# Patient Record
Sex: Female | Born: 1980 | Race: Black or African American | Hispanic: No | Marital: Married | Smoking: Current some day smoker
Health system: Southern US, Community
[De-identification: ages and names within clinical notes are randomized; demographics above are authoritative.]

## PROBLEM LIST (undated history)

## (undated) ENCOUNTER — Inpatient Hospital Stay (HOSPITAL_COMMUNITY): Payer: Self-pay

## (undated) DIAGNOSIS — I10 Essential (primary) hypertension: Secondary | ICD-10-CM

## (undated) DIAGNOSIS — R87629 Unspecified abnormal cytological findings in specimens from vagina: Secondary | ICD-10-CM

## (undated) HISTORY — DX: Unspecified abnormal cytological findings in specimens from vagina: R87.629

## (undated) HISTORY — PX: LEG SURGERY: SHX1003

## (undated) HISTORY — PX: KNEE CARTILAGE SURGERY: SHX688

---

## 2010-01-07 ENCOUNTER — Encounter: Payer: Self-pay | Admitting: Obstetrics and Gynecology

## 2010-01-07 ENCOUNTER — Inpatient Hospital Stay (HOSPITAL_COMMUNITY): Admission: RE | Admit: 2010-01-07 | Discharge: 2010-01-09 | Payer: Self-pay | Admitting: Obstetrics and Gynecology

## 2011-03-15 LAB — CBC
HCT: 29.3 % — ABNORMAL LOW (ref 36.0–46.0)
HCT: 33.2 % — ABNORMAL LOW (ref 36.0–46.0)
Hemoglobin: 9.8 g/dL — ABNORMAL LOW (ref 12.0–15.0)
MCHC: 33.5 g/dL (ref 30.0–36.0)
Platelets: 322 10*3/uL (ref 150–400)
RBC: 3.09 MIL/uL — ABNORMAL LOW (ref 3.87–5.11)
RBC: 3.54 MIL/uL — ABNORMAL LOW (ref 3.87–5.11)
WBC: 12.5 10*3/uL — ABNORMAL HIGH (ref 4.0–10.5)

## 2011-03-15 LAB — RPR: RPR Ser Ql: NONREACTIVE

## 2013-10-05 ENCOUNTER — Other Ambulatory Visit: Payer: Self-pay

## 2013-10-05 ENCOUNTER — Emergency Department (HOSPITAL_BASED_OUTPATIENT_CLINIC_OR_DEPARTMENT_OTHER)
Admission: EM | Admit: 2013-10-05 | Discharge: 2013-10-05 | Discharge status: Against Medical Advice | Payer: Medicaid Other | Attending: Emergency Medicine | Admitting: Emergency Medicine

## 2013-10-05 ENCOUNTER — Encounter (HOSPITAL_BASED_OUTPATIENT_CLINIC_OR_DEPARTMENT_OTHER): Payer: Self-pay | Admitting: Emergency Medicine

## 2013-10-05 ENCOUNTER — Emergency Department (HOSPITAL_BASED_OUTPATIENT_CLINIC_OR_DEPARTMENT_OTHER): Payer: Medicaid Other

## 2013-10-05 DIAGNOSIS — R072 Precordial pain: Secondary | ICD-10-CM | POA: Insufficient documentation

## 2013-10-05 DIAGNOSIS — R079 Chest pain, unspecified: Secondary | ICD-10-CM

## 2013-10-05 DIAGNOSIS — F17200 Nicotine dependence, unspecified, uncomplicated: Secondary | ICD-10-CM | POA: Insufficient documentation

## 2013-10-05 DIAGNOSIS — Z3202 Encounter for pregnancy test, result negative: Secondary | ICD-10-CM | POA: Insufficient documentation

## 2013-10-05 MED ORDER — ASPIRIN 81 MG PO CHEW
324.0000 mg | CHEWABLE_TABLET | Freq: Once | ORAL | Status: AC
  Administered 2013-10-05: 324 mg via ORAL
  Filled 2013-10-05: qty 4

## 2013-10-05 MED ORDER — NITROGLYCERIN 0.4 MG SL SUBL
0.4000 mg | SUBLINGUAL_TABLET | SUBLINGUAL | Status: DC | PRN
  Administered 2013-10-05: 0.4 mg via SUBLINGUAL
  Filled 2013-10-05: qty 25

## 2013-10-05 NOTE — ED Notes (Signed)
Pt laughing and playing with her daughters during assessment and triage.

## 2013-10-05 NOTE — ED Provider Notes (Signed)
CSN: [id number]     Arrival date & time 10/05/13  1027 History   First MD Initiated Contact with Patient 10/05/13 0914     Chief Complaint  Patient presents with  . Chest Pain   (Consider location/radiation/quality/duration/timing/severity/associated sxs/prior Treatment) HPI Comments: Pt comes in with cc of chest pain. No medical hx, but does admit to family hx of premature CAD (father, age 11s). Pt reports having intermittent chest pain, unprovoked, that is midsternal, and no radiating. She occasionally gets dib and sweats with it. Same pain started at 3 am yday, unprovoked, no n/v/f/c, cough. No hx of dvt, pe and no risk factors for the same. Pt has not had a cardiac eval done so far. Pt is hacing the pain during our evaluation.  Patient is a 32 y.o. female presenting with chest pain. The history is provided by the patient.  Chest Pain Associated symptoms: no abdominal pain, no headache, no nausea, no shortness of breath and not vomiting     History reviewed. No pertinent past medical history. History reviewed. No pertinent past surgical history. History reviewed. No pertinent family history. History  Substance Use Topics  . Smoking status: Current Every Day Smoker -- 0.50 packs/day  . Smokeless tobacco: Not on file  . Alcohol Use: No   OB History   Grav Para Term Preterm Abortions TAB SAB Ect Mult Living                 Review of Systems  Constitutional: Negative for activity change.  Respiratory: Negative for shortness of breath.   Cardiovascular: Positive for chest pain.  Gastrointestinal: Negative for nausea, vomiting and abdominal pain.  Genitourinary: Negative for dysuria.  Musculoskeletal: Negative for neck pain.  Neurological: Negative for headaches.    Allergies  Review of patient's allergies indicates no known allergies.  Home Medications  No current outpatient prescriptions on file. BP 120/91  Pulse 57  Temp(Src) 98.1 F (36.7 C) (Oral)  Resp 18  Ht 5\' 4"   (1.626 m)  Wt 220 lb (99.791 kg)  BMI 37.74 kg/m2  SpO2 100% Physical Exam  Nursing note and vitals reviewed. Constitutional: She is oriented to person, place, and time. She appears well-developed and well-nourished.  HENT:  Head: Normocephalic and atraumatic.  Eyes: EOM are normal. Pupils are equal, round, and reactive to light.  Neck: Neck supple.  Cardiovascular: Normal rate, regular rhythm and normal heart sounds.   No murmur heard. Pulmonary/Chest: Effort normal. No respiratory distress.  Abdominal: Soft. She exhibits no distension. There is no tenderness. There is no rebound and no guarding.  Neurological: She is alert and oriented to person, place, and time.  Skin: Skin is warm and dry.    ED Course  Procedures (including critical care time) Labs Review Labs Reviewed  CBC WITH DIFFERENTIAL  BASIC METABOLIC PANEL  TROPONIN I  URINALYSIS, ROUTINE W REFLEX MICROSCOPIC  PREGNANCY, URINE   Imaging Review Dg Chest 2 View  10/05/2013   CLINICAL DATA:  Chest tightness for 9 months, smoker  EXAM: CHEST  2 VIEW  COMPARISON:  None.  FINDINGS: The heart size and mediastinal contours are within normal limits. Both lungs are clear. The visualized skeletal structures are unremarkable.  IMPRESSION: No active cardiopulmonary disease.   Electronically Signed   By: Esperanza Heir M.D.   On: 10/05/2013 09:46    EKG Interpretation   None       MDM  No diagnosis found.   Date: 10/05/2013  Rate: 59  Rhythm:  normal sinus rhythm  QRS Axis: normal  Intervals: normal  ST/T Wave abnormalities: normal  Conduction Disutrbances: none  Narrative Interpretation: unremarkable  Differential diagnosis includes: ACS syndrome CHF exacerbation Valvular disorder Myocarditis Pericarditis Pericardial effusion Pneumonia Pleural effusion Pulmonary edema PE Anemia Musculoskeletal pain  Pt comes in with mid sternal chest pain, pressure like, intermittent x 9 months. Our concerns are  possible ACS event, especially given premature CAD in the family and some concerning constitutionals.  We initiated the workup in the ED. Pt refused nitro and asked for work note for today and tomorrow.  i had informed her that we will need to finish the workup before we give her any note, and just give 1 day work note.  Allegedly, patient got agitated with the RNs few minutes later, again over the work note issue, and wanted to leave. I had informed RNs to haver her sign AMA if she leaves, and it appears that she just refused to sign any thing and eloped.  Derwood Kaplan, MD 10/05/13 1105

## 2013-10-05 NOTE — ED Notes (Signed)
Pt reports intermitted CP x 8-9 months ago. Denies SOB or sweating today but states "sometimes when they start I do get short of breath" Here today "to find out what's going on"

## 2013-10-05 NOTE — ED Notes (Addendum)
Plan for treatment explained to the patient. When Nitroglycerin mechanism of action was explained pt stated "that is for real pain, I don't want that". Further education was provided and patient then accepted to take medication. Patient then stated "the doctor told me I could just get my work note" "I can't stay here for all those tests". Pt stated she "need someone to look after my kids." I reported pt's comments to EDP, he explained tests were to ensure pt's safety this was communicated to patient. Patient stated she didn't "have time for all that" as she "needed someone to watch her kids" she stated she "just needed a note to be out of work today and tomorrow" and "I can't stay here with my kids". Pt refused to have labs drawn and provide urine specimen. Risks of leaving were discussed with patient. Pt verbalized understanding.

## 2013-10-05 NOTE — ED Notes (Signed)
Pt ambulated from room 11 to xray and back. No acute distress noted.

## 2014-01-07 ENCOUNTER — Encounter (HOSPITAL_BASED_OUTPATIENT_CLINIC_OR_DEPARTMENT_OTHER): Payer: Self-pay | Admitting: Emergency Medicine

## 2014-01-07 ENCOUNTER — Emergency Department (HOSPITAL_BASED_OUTPATIENT_CLINIC_OR_DEPARTMENT_OTHER): Payer: Medicaid Other

## 2014-01-07 ENCOUNTER — Emergency Department (HOSPITAL_BASED_OUTPATIENT_CLINIC_OR_DEPARTMENT_OTHER)
Admission: EM | Admit: 2014-01-07 | Discharge: 2014-01-07 | Disposition: A | Discharge status: Home / Self Care | Payer: Medicaid Other | Attending: Emergency Medicine | Admitting: Emergency Medicine

## 2014-01-07 DIAGNOSIS — IMO0002 Reserved for concepts with insufficient information to code with codable children: Secondary | ICD-10-CM | POA: Insufficient documentation

## 2014-01-07 DIAGNOSIS — S8392XA Sprain of unspecified site of left knee, initial encounter: Secondary | ICD-10-CM

## 2014-01-07 DIAGNOSIS — X500XXA Overexertion from strenuous movement or load, initial encounter: Secondary | ICD-10-CM | POA: Insufficient documentation

## 2014-01-07 DIAGNOSIS — S76911A Strain of unspecified muscles, fascia and tendons at thigh level, right thigh, initial encounter: Secondary | ICD-10-CM

## 2014-01-07 DIAGNOSIS — R296 Repeated falls: Secondary | ICD-10-CM | POA: Insufficient documentation

## 2014-01-07 DIAGNOSIS — F17200 Nicotine dependence, unspecified, uncomplicated: Secondary | ICD-10-CM | POA: Insufficient documentation

## 2014-01-07 DIAGNOSIS — Y939 Activity, unspecified: Secondary | ICD-10-CM | POA: Insufficient documentation

## 2014-01-07 DIAGNOSIS — Y9229 Other specified public building as the place of occurrence of the external cause: Secondary | ICD-10-CM | POA: Insufficient documentation

## 2014-01-07 MED ORDER — HYDROCODONE-ACETAMINOPHEN 5-325 MG PO TABS: 2.0000 | ORAL_TABLET | Freq: Four times a day (QID) | ORAL | Status: DC | PRN

## 2014-01-07 NOTE — ED Provider Notes (Signed)
CSN: [id number]     Arrival date & time 01/07/14  1744 History   First MD Initiated Contact with Patient 01/07/14 1934     Chief Complaint  Patient presents with  . Fall   (Consider location/radiation/quality/duration/timing/severity/associated sxs/prior Treatment) HPI Slipped and fell in a restaurant 2 days ago and feels like she pulled a muscle in her right thigh hamstrings area as well as twisted her left knee with some left lateral knee joint pain, there is no head or neck injury no amnesia no neck pain no back pain no chest pain no shortness breath no abdominal pain no injury to her arms no pain to her hips ankles or feet she is walking with very mild limp without weakness or numbness there is no swelling or deformity to her legs her pain is mild to moderate but she is having trouble sleeping at night and would like pain medicine for nighttime there is no treatment prior to arrival.  History reviewed. No pertinent past medical history. Past Surgical History  Procedure Laterality Date  . Leg surgery     No family history on file. History  Substance Use Topics  . Smoking status: Current Every Day Smoker -- 0.50 packs/day  . Smokeless tobacco: Never Used  . Alcohol Use: Yes     Comment: occasional   OB History   Grav Para Term Preterm Abortions TAB SAB Ect Mult Living                 Review of Systems 10 Systems reviewed and are negative for acute change except as noted in the HPI. Allergies  Review of patient's allergies indicates no known allergies.  Home Medications   Current Outpatient Rx  Name  Route  Sig  Dispense  Refill  . HYDROcodone-acetaminophen (NORCO) 5-325 MG per tablet   Oral   Take 2 tablets by mouth every 6 (six) hours as needed for severe pain.   10 tablet   0    BP 132/81  Pulse 70  Temp(Src) 99.5 F (37.5 C) (Oral)  Resp 20  Ht 5\' 4"  (1.626 m)  Wt 230 lb (104.327 kg)  BMI 39.46 kg/m2  SpO2 98% Physical Exam  Nursing note and vitals  reviewed. Constitutional:  Awake, alert, nontoxic appearance.  HENT:  Head: Atraumatic.  Eyes: Right eye exhibits no discharge. Left eye exhibits no discharge.  Neck: Neck supple.  Pulmonary/Chest: Effort normal. She exhibits no tenderness.  Abdominal: Soft. There is no tenderness. There is no rebound.  Musculoskeletal: She exhibits tenderness.  Baseline ROM, no obvious new focal weakness. Normal gait. Bilateral hips ankles and feet nontender bilateral dorsalis pedis pulses intact normal light touch both legs good movement both legs her right hamstrings area is mildly tender right distal quadriceps minimally tender right patella and patellar tendon nontender she has full extension against resistance right knee she has full flexion right knee she has negative Lachman's negative McMurray's no laxity with varus or valgus stress testing right knee her left leg examination reveals no tenderness to her thigh or calf her left knee has mild lateral joint line tenderness with no tenderness over the medial left knee joint line she has minimal tenderness over the left patella and patellar tendon with full extension against resistance she's negative Lachman's testing negative the nares testing no laxity with varus or valgus stress testing bilateral calf muscles are nontender there is no swelling or deformity of her knees.  Neurological: She is alert.  Mental status and motor  strength appears baseline for patient and situation.  Skin: No rash noted.  Psychiatric: She has a normal mood and affect.    ED Course  Procedures (including critical care time) Patient / Family / Caregiver informed of clinical course, understand medical decision-making process, and agree with plan. Labs Review Labs Reviewed - No data to display Imaging Review Dg Knee Complete 4 Views Left  01/07/2014   CLINICAL DATA:  Left knee pain  EXAM: LEFT KNEE - COMPLETE 4+ VIEW  COMPARISON:  None.  FINDINGS: No acute fracture is identified.  Degenerative changes in the medial joint space are seen. Some remodeling of the medial tibial plateau is noted which may be related to prior trauma with healing. Clinical correlation is recommended.  IMPRESSION: No acute abnormality noted.   Electronically Signed   By: Alcide Clever M.D.   On: 01/07/2014 18:20   Dg Knee Complete 4 Views Right  01/07/2014   CLINICAL DATA:  Right knee pain  EXAM: RIGHT KNEE - COMPLETE 4+ VIEW  COMPARISON:  None.  FINDINGS: No acute fracture is identified. There is some remodeling of the medial tibial plateau suggesting prior fracture with healing. Clinical correlation is recommended.  IMPRESSION: No acute abnormality noted.   Electronically Signed   By: Alcide Clever M.D.   On: 01/07/2014 18:19    EKG Interpretation   None       MDM   1. Muscle strain of thigh, right, initial encounter   2. Left knee sprain, initial encounter    I doubt any other EMC precluding discharge at this time.    Hurman Horn, MD 01/08/14 [phone]

## 2014-01-07 NOTE — ED Notes (Signed)
Pt reports she fell at a restaurant Friday night and landed on both knees. States she has a sharp pain in the back of her legs "feels like pulling"

## 2014-01-07 NOTE — Discharge Instructions (Signed)
Activity as tolerated. Ice pack, heat pad, or massage for up to 20 minutes at a time as needed. Return sooner if you develop weakness or numbness, redness, fever, color change to your legs, swelling to her legs, chest pain, shortness breath, or other concerns.

## 2016-12-28 NOTE — L&D Delivery Note (Signed)
Patient is a 36 y.o. now G3P3003s/p NSVD at 3377w6d, who was admitted for IOL for gHTN.  Delivery Note At 2:48 PM a viable female was delivered via Vaginal, Spontaneous Delivery (Presentation:cephalic ; OA).  APGAR: 9, 9; weight pending Placenta status:intact .  Cord: 3 vessel  with the following complications: placenta needing manual extraction .    Anesthesia: epidural Episiotomy: None Lacerations: None Suture Repair: none Est. Blood Loss (mL): 400  Mom to postpartum.  Baby to Couplet care / Skin to Skin.  Head delivered OA. No nuchal cord present. Shoulder and body delivered in usual fashion. Infant with spontaneous cry, placed on mother's abdomen, dried and bulb suctioned. Cord clamped x 2 after 1-minute delay, and cut by family member. Cord blood drawn. After 30 minutes of gentle cord traction a Manual extraction of placenta was attempted by Donette LarryMelanie Amal Saiki, CNM and then completed by Dr. Catalina AntiguaPeggy Constant. The placenta was succesfully delivered, intact. No retained products. Fundus firm with massage and Pitocin. Perineum inspected with no laceration,   Nancy S. Minott,  MD Family Medicine Resident PGY-1 10/26/17, 4:07 PM  Midwife attestation: I was gloved and present for delivery in its entirety and I agree with the above resident's note.  Donette LarryMelanie Alamin Jacobs, CNM 5:09 PM

## 2017-05-10 LAB — OB RESULTS CONSOLE RUBELLA ANTIBODY, IGM: RUBELLA: IMMUNE

## 2017-05-10 LAB — OB RESULTS CONSOLE GC/CHLAMYDIA
Chlamydia: NEGATIVE
Gonorrhea: NEGATIVE

## 2017-05-10 LAB — OB RESULTS CONSOLE HEPATITIS B SURFACE ANTIGEN: HEP B S AG: NEGATIVE

## 2017-05-10 LAB — OB RESULTS CONSOLE ANTIBODY SCREEN: ANTIBODY SCREEN: NEGATIVE

## 2017-05-10 LAB — OB RESULTS CONSOLE HGB/HCT, BLOOD
HCT: 37
Hemoglobin: 12

## 2017-05-10 LAB — OB RESULTS CONSOLE ABO/RH: RH Type: POSITIVE

## 2017-05-10 LAB — OB RESULTS CONSOLE HIV ANTIBODY (ROUTINE TESTING): HIV: NONREACTIVE

## 2017-06-18 ENCOUNTER — Encounter: Payer: Self-pay | Admitting: Obstetrics & Gynecology

## 2017-06-18 ENCOUNTER — Ambulatory Visit (INDEPENDENT_AMBULATORY_CARE_PROVIDER_SITE_OTHER): Payer: Medicaid Other | Admitting: Obstetrics & Gynecology

## 2017-06-18 VITALS — BP 121/72 | HR 72 | Wt 241.0 lb

## 2017-06-18 DIAGNOSIS — O09529 Supervision of elderly multigravida, unspecified trimester: Secondary | ICD-10-CM | POA: Insufficient documentation

## 2017-06-18 DIAGNOSIS — Z3492 Encounter for supervision of normal pregnancy, unspecified, second trimester: Secondary | ICD-10-CM

## 2017-06-18 DIAGNOSIS — O99212 Obesity complicating pregnancy, second trimester: Secondary | ICD-10-CM

## 2017-06-18 DIAGNOSIS — O09522 Supervision of elderly multigravida, second trimester: Secondary | ICD-10-CM

## 2017-06-18 DIAGNOSIS — O99210 Obesity complicating pregnancy, unspecified trimester: Secondary | ICD-10-CM | POA: Insufficient documentation

## 2017-06-18 DIAGNOSIS — Z3480 Encounter for supervision of other normal pregnancy, unspecified trimester: Secondary | ICD-10-CM | POA: Insufficient documentation

## 2017-06-18 NOTE — Progress Notes (Signed)
   PRENATAL VISIT NOTE  Subjective:  Nancy Jacobs is a 36 y.o. S AA  G3P2000 at 8133w2d being seen today for ongoing prenatal care.  She is currently monitored for the following issues for this low-risk pregnancy and has Supervision of other normal pregnancy, antepartum; Obesity in pregnancy; and AMA (advanced maternal age) multigravida 35+ on her problem list.  Patient reports no complaints.   . Vag. Bleeding: None.  Movement: Present. Denies leaking of fluid.   The following portions of the patient's history were reviewed and updated as appropriate: allergies, current medications, past family history, past medical history, past social history, past surgical history and problem list. Problem list updated.  Objective:   Vitals:   06/18/17 0929  BP: 121/72  Pulse: 72  Weight: 241 lb (109.3 kg)    Fetal Status: Fetal Heart Rate (bpm): 135   Movement: Present     General:  Alert, oriented and cooperative. Patient is in no acute distress.  Skin: Skin is warm and dry. No rash noted.   Cardiovascular: Normal heart rate noted  Respiratory: Normal respiratory effort, no problems with respiration noted  Abdomen: Soft, gravid, appropriate for gestational age. Pain/Pressure: Absent     Pelvic:  Cervical exam deferred        Extremities: Normal range of motion.  Edema: None  Mental Status: Normal mood and affect. Normal behavior. Normal judgment and thought content.   Assessment and Plan:  Pregnancy: G3P2000 at 4633w2d  1. Prenatal care in second trimester  - US MFM OB DETAIL +14 WK; Future  2. Supervision of other normal pregnancy, antepartum   3. Obesity in pregnancy  - Hemoglobin A1c  4. Elderly multigravida in second trimester - normal NIPS  Preterm labor symptoms and general obstetric precautions including but not limited to vaginal bleeding, contractions, leaking of fluid and fetal movement were reviewed in detail with the patient. Please refer to After Visit Summary for other  counseling recommendations.  Return in about 4 weeks (around 07/16/2017).   Allie BossierMyra C Seanpatrick Maisano, MD

## 2017-06-19 LAB — HEMOGLOBIN A1C
Est. average glucose Bld gHb Est-mCnc: 117 mg/dL
Hgb A1c MFr Bld: 5.7 % — ABNORMAL HIGH (ref 4.8–5.6)

## 2017-06-24 ENCOUNTER — Emergency Department (HOSPITAL_BASED_OUTPATIENT_CLINIC_OR_DEPARTMENT_OTHER): Payer: Medicaid Other

## 2017-06-24 ENCOUNTER — Emergency Department (HOSPITAL_BASED_OUTPATIENT_CLINIC_OR_DEPARTMENT_OTHER)
Admission: EM | Admit: 2017-06-24 | Discharge: 2017-06-24 | Disposition: A | Discharge status: Home / Self Care | Payer: Medicaid Other | Attending: Emergency Medicine | Admitting: Emergency Medicine

## 2017-06-24 ENCOUNTER — Encounter (HOSPITAL_BASED_OUTPATIENT_CLINIC_OR_DEPARTMENT_OTHER): Payer: Self-pay | Admitting: Emergency Medicine

## 2017-06-24 DIAGNOSIS — Z87891 Personal history of nicotine dependence: Secondary | ICD-10-CM | POA: Insufficient documentation

## 2017-06-24 DIAGNOSIS — O2312 Infections of bladder in pregnancy, second trimester: Secondary | ICD-10-CM | POA: Diagnosis not present

## 2017-06-24 DIAGNOSIS — Z3A20 20 weeks gestation of pregnancy: Secondary | ICD-10-CM | POA: Diagnosis not present

## 2017-06-24 DIAGNOSIS — N3001 Acute cystitis with hematuria: Secondary | ICD-10-CM

## 2017-06-24 DIAGNOSIS — R1012 Left upper quadrant pain: Secondary | ICD-10-CM | POA: Insufficient documentation

## 2017-06-24 DIAGNOSIS — O9989 Other specified diseases and conditions complicating pregnancy, childbirth and the puerperium: Secondary | ICD-10-CM | POA: Diagnosis present

## 2017-06-24 LAB — URINALYSIS, ROUTINE W REFLEX MICROSCOPIC
BILIRUBIN URINE: NEGATIVE
Glucose, UA: NEGATIVE mg/dL
Ketones, ur: 15 mg/dL — AB
LEUKOCYTES UA: NEGATIVE
NITRITE: NEGATIVE
PH: 5.5 (ref 5.0–8.0)
Protein, ur: NEGATIVE mg/dL
SPECIFIC GRAVITY, URINE: 1.03 (ref 1.005–1.030)

## 2017-06-24 LAB — URINALYSIS, MICROSCOPIC (REFLEX)

## 2017-06-24 LAB — PREGNANCY, URINE: PREG TEST UR: POSITIVE — AB

## 2017-06-24 MED ORDER — CEPHALEXIN 500 MG PO CAPS: 500.0000 mg | ORAL_CAPSULE | Freq: Three times a day (TID) | ORAL | 0 refills | Status: DC

## 2017-06-24 MED ORDER — CEPHALEXIN 250 MG PO CAPS
500.0000 mg | ORAL_CAPSULE | Freq: Once | ORAL | Status: AC
  Administered 2017-06-24: 500 mg via ORAL
  Filled 2017-06-24: qty 2

## 2017-06-24 NOTE — ED Provider Notes (Signed)
[facility]   CSN: [id number] Arrival date & time: 06/24/17  1646   By signing my name below, I, Vista Minkobert Ross, attest that this documentation has been prepared under the direction and in the presence of Rolland PorterJames, Edgel Degnan, MD. Electronically signed, Vista Minkobert Ross, ED Scribe. 06/24/17. 8:11 PM.  History   Chief Complaint Chief Complaint  Patient presents with  . Abdominal Pain    20 weeks preg    HPI HPI Comments: Nancy Jacobs is a 36 y.o. female who is G3P2A2, who presents to the Emergency Department complaining of constant, dull suprapubic abdominal pain that radiates to her lower back, onset four days ago. Pt also reports intermittent nausea but states that this has been similar to her previous pregnancies and has not been worse during this pregnancy. Pt further reports intermittent dysuria. Her UA here showed moderate hgb but pt denies any spotting noticed. She is currently seeing an OB here. No fever.  The history is provided by the patient. No language interpreter was used.    Past Medical History:  Diagnosis Date  . Vaginal Pap smear, abnormal     Patient Active Problem List   Diagnosis Date Noted  . Supervision of other normal pregnancy, antepartum 06/18/2017  . Obesity in pregnancy 06/18/2017  . AMA (advanced maternal age) multigravida 35+ 06/18/2017    Past Surgical History:  Procedure Laterality Date  . KNEE CARTILAGE SURGERY    . LEG SURGERY      OB History    Gravida Para Term Preterm AB Living   3 2 2          SAB TAB Ectopic Multiple Live Births           2       Home Medications    Prior to Admission medications   Medication Sig Start Date End Date Taking? Authorizing Provider  cephALEXin (KEFLEX) 500 MG capsule Take 1 capsule (500 mg total) by mouth 3 (three) times daily. 06/24/17   Rolland PorterJames, Jetta Murray, MD  Doxylamine-Pyridoxine 10-10 MG TBEC Take by mouth. 04/16/17   [provider]  IRON PO Take by mouth.    [provider]    Family History Family History  Problem Relation Age of Onset  . Cancer Mother        liver  . Hypertension Mother   . Hypertension Father   . Hypertension Sister   . Cancer Maternal Aunt   . Cancer Maternal Grandmother   . Diabetes Maternal Grandfather     Social History Social History  Substance Use Topics  . Smoking status: Former Smoker    Packs/day: 0.50  . Smokeless tobacco: Never Used  . Alcohol use Yes     Comment: occasional     Allergies   Patient has no known allergies.   Review of Systems Review of Systems  Constitutional: Negative for appetite change, chills, diaphoresis, fatigue and fever.  HENT: Negative for mouth sores, sore throat and trouble swallowing.   Eyes: Negative for visual disturbance.  Respiratory: Negative for cough, chest tightness, shortness of breath and wheezing.   Cardiovascular: Negative for chest pain.  Gastrointestinal: Positive for abdominal pain (suprapubic radiating to lower back) and nausea. Negative for abdominal distention, diarrhea and vomiting.  Endocrine: Negative for polydipsia, polyphagia and polyuria.  Genitourinary: Positive for dysuria. Negative for frequency and hematuria.  Musculoskeletal: Negative for gait problem.  Skin: Negative for color change, pallor and rash.  Neurological: Negative for dizziness, syncope, light-headedness and headaches.  Hematological: Does not bruise/bleed easily.  Psychiatric/Behavioral: Negative for behavioral problems and confusion.     Physical Exam Updated Vital Signs BP 118/73 (BP Location: Right Arm)   Pulse 68   Resp 18   Ht 5\' 4"  (1.626 m)   Wt 108.9 kg (240 lb)   LMP 02/03/2017 (Exact Date)   SpO2 100%   BMI 41.20 kg/m   Physical Exam   ED Treatments / Results  DIAGNOSTIC STUDIES: Oxygen Saturation is 100% on RA, normal by my interpretation.  COORDINATION OF CARE: 8:14 PM-Discussed treatment plan with pt at bedside and pt agreed to plan.    Labs (all labs ordered are listed, but only abnormal results are displayed) Labs Reviewed  URINALYSIS, ROUTINE W REFLEX MICROSCOPIC - Abnormal; Notable for the following:       Result Value   APPearance CLOUDY (*)    Hgb urine dipstick MODERATE (*)    Ketones, ur 15 (*)    All other components within normal limits  URINALYSIS, MICROSCOPIC (REFLEX) - Abnormal; Notable for the following:    Bacteria, UA MANY (*)    Squamous Epithelial / LPF 6-30 (*)    All other components within normal limits  PREGNANCY, URINE - Abnormal; Notable for the following:    Preg Test, Ur POSITIVE (*)    All other components within normal limits  URINE CULTURE    EKG  EKG Interpretation None       Radiology US Renal  Result Date: 06/24/2017 CLINICAL DATA:  Left flank pain for 4 days EXAM: RENAL / URINARY TRACT ULTRASOUND COMPLETE COMPARISON:  None. FINDINGS: Right Kidney: Length: 12.6 cm. Echogenicity within normal limits. No mass or hydronephrosis visualized. Multiple hypoechoic areas near the renal hilum may be parapelvic cysts or prominent pyramids. Left Kidney: Length: 12.6 cm. Echogenicity within normal limits. No mass or hydronephrosis visualized. Bladder: Appears normal for degree of bladder distention. The left ureteral jet was visualized. IMPRESSION: No nephrolithiasis or hydronephrosis. Electronically Signed   By: Deatra Robinson M.D.   On: 06/24/2017 21:23    Procedures Procedures (including critical care time)  Medications Ordered in ED Medications  cephALEXin (KEFLEX) capsule 500 mg (500 mg Oral Given 06/24/17 2139)     Initial Impression / Assessment and Plan / ED Course  I have reviewed the triage vital signs and the nursing notes.  Pertinent labs & imaging results that were available during my care of the patient were reviewed by me and considered in my medical decision making (see chart for details).     Blood, white blood cells, and bacteria in urine. Culture pending.  Negative ultrasound. Reassuring bedside ultrasound showing positive fetal heart tones and fetal movement. We'll treat with Keflex. Symptoms are mild. Exam is benign. OB/GYN follow-up. ER with acute changes.  Final Clinical Impressions(s) / ED Diagnoses   Final diagnoses:  Acute cystitis with hematuria    New Prescriptions New Prescriptions   CEPHALEXIN (KEFLEX) 500 MG CAPSULE    Take 1 capsule (500 mg total) by mouth 3 (three) times daily.  I personally performed the services described in this documentation, which was scribed in my presence. The recorded information has been reviewed and is accurate.     Rolland Porter, MD 06/24/17 2221

## 2017-06-24 NOTE — ED Triage Notes (Signed)
Patient reports that she is having pain to her left upper and lower quadrants. The patient states that she is [redacted] weeks pregnant. Patient states that this pain has been going on for 3 days.

## 2017-06-24 NOTE — Discharge Instructions (Signed)
Follow-up with your OB/GYN physician. Return to ER as needed for new or worsening symptoms

## 2017-06-24 NOTE — ED Notes (Signed)
Pt stated that pain started 4 days ago after lifting a box from her work.  She just the pain to go away.

## 2017-06-26 LAB — URINE CULTURE

## 2017-06-29 ENCOUNTER — Ambulatory Visit (HOSPITAL_COMMUNITY): Payer: Medicaid Other | Attending: Obstetrics & Gynecology

## 2017-07-14 ENCOUNTER — Other Ambulatory Visit: Payer: Self-pay | Admitting: Obstetrics & Gynecology

## 2017-07-14 ENCOUNTER — Ambulatory Visit (HOSPITAL_COMMUNITY)
Admission: RE | Admit: 2017-07-14 | Discharge: 2017-07-14 | Disposition: A | Discharge status: Home / Self Care | Payer: Medicaid Other | Source: Ambulatory Visit | Attending: Obstetrics & Gynecology | Admitting: Obstetrics & Gynecology

## 2017-07-14 DIAGNOSIS — Z3A23 23 weeks gestation of pregnancy: Secondary | ICD-10-CM | POA: Diagnosis not present

## 2017-07-14 DIAGNOSIS — O09522 Supervision of elderly multigravida, second trimester: Secondary | ICD-10-CM

## 2017-07-14 DIAGNOSIS — Z369 Encounter for antenatal screening, unspecified: Secondary | ICD-10-CM

## 2017-07-14 DIAGNOSIS — Z3492 Encounter for supervision of normal pregnancy, unspecified, second trimester: Secondary | ICD-10-CM

## 2017-07-14 DIAGNOSIS — Z363 Encounter for antenatal screening for malformations: Secondary | ICD-10-CM | POA: Diagnosis not present

## 2017-07-16 ENCOUNTER — Encounter: Payer: Medicaid Other | Admitting: Obstetrics & Gynecology

## 2017-07-22 ENCOUNTER — Ambulatory Visit (INDEPENDENT_AMBULATORY_CARE_PROVIDER_SITE_OTHER): Payer: Medicaid Other | Admitting: Family Medicine

## 2017-07-22 VITALS — BP 116/73 | HR 74 | Wt 238.0 lb

## 2017-07-22 DIAGNOSIS — Z3482 Encounter for supervision of other normal pregnancy, second trimester: Secondary | ICD-10-CM

## 2017-07-22 DIAGNOSIS — O09522 Supervision of elderly multigravida, second trimester: Secondary | ICD-10-CM

## 2017-07-22 DIAGNOSIS — Z3480 Encounter for supervision of other normal pregnancy, unspecified trimester: Secondary | ICD-10-CM

## 2017-07-22 NOTE — Progress Notes (Signed)
   PRENATAL VISIT NOTE  Subjective:  Nancy Jacobs is a 36 y.o. G3P2000 at 4157w1d being seen today for ongoing prenatal care.  She is currently monitored for the following issues for this high-risk pregnancy and has Supervision of other normal pregnancy, antepartum; Obesity in pregnancy; and AMA (advanced maternal age) multigravida 35+ on her problem list.  Patient reports no complaints.  Contractions: Not present. Vag. Bleeding: None.  Movement: Present. Denies leaking of fluid.   The following portions of the patient's history were reviewed and updated as appropriate: allergies, current medications, past family history, past medical history, past social history, past surgical history and problem list. Problem list updated.  Objective:   Vitals:   07/22/17 1042  BP: 116/73  Pulse: 74  Weight: 238 lb (108 kg)    Fetal Status: Fetal Heart Rate (bpm): 135   Movement: Present     General:  Alert, oriented and cooperative. Patient is in no acute distress.  Skin: Skin is warm and dry. No rash noted.   Cardiovascular: Normal heart rate noted  Respiratory: Normal respiratory effort, no problems with respiration noted  Abdomen: Soft, gravid, appropriate for gestational age.  Pain/Pressure: Absent     Pelvic: Cervical exam deferred        Extremities: Normal range of motion.  Edema: None  Mental Status:  Normal mood and affect. Normal behavior. Normal judgment and thought content.   Assessment and Plan:  Pregnancy: G3P2000 at 4957w1d  1. Supervision of other normal pregnancy, antepartum FHT and FH normal. Follow up spine views - US MFM OB FOLLOW UP; Future  2. Elderly multigravida in second trimester - US MFM OB FOLLOW UP; Future  Preterm labor symptoms and general obstetric precautions including but not limited to vaginal bleeding, contractions, leaking of fluid and fetal movement were reviewed in detail with the patient. Please refer to After Visit Summary for other counseling  recommendations.  Return in about 4 weeks (around 08/19/2017) for ob with 2 hr GTT.   Levie HeritageJacob J Stinson, DO

## 2017-08-19 ENCOUNTER — Ambulatory Visit (INDEPENDENT_AMBULATORY_CARE_PROVIDER_SITE_OTHER): Payer: Medicaid Other | Admitting: Family Medicine

## 2017-08-19 ENCOUNTER — Ambulatory Visit (HOSPITAL_COMMUNITY)
Admission: RE | Admit: 2017-08-19 | Discharge: 2017-08-19 | Disposition: A | Discharge status: Home / Self Care | Payer: Medicaid Other | Source: Ambulatory Visit | Attending: Family Medicine | Admitting: Family Medicine

## 2017-08-19 ENCOUNTER — Other Ambulatory Visit: Payer: Self-pay | Admitting: Family Medicine

## 2017-08-19 VITALS — BP 123/75 | HR 71 | Wt 239.0 lb

## 2017-08-19 DIAGNOSIS — Z3492 Encounter for supervision of normal pregnancy, unspecified, second trimester: Secondary | ICD-10-CM

## 2017-08-19 DIAGNOSIS — Z362 Encounter for other antenatal screening follow-up: Secondary | ICD-10-CM

## 2017-08-19 DIAGNOSIS — Z3480 Encounter for supervision of other normal pregnancy, unspecified trimester: Secondary | ICD-10-CM

## 2017-08-19 DIAGNOSIS — O09523 Supervision of elderly multigravida, third trimester: Secondary | ICD-10-CM

## 2017-08-19 DIAGNOSIS — Z3493 Encounter for supervision of normal pregnancy, unspecified, third trimester: Secondary | ICD-10-CM

## 2017-08-19 DIAGNOSIS — E669 Obesity, unspecified: Secondary | ICD-10-CM | POA: Insufficient documentation

## 2017-08-19 DIAGNOSIS — O99213 Obesity complicating pregnancy, third trimester: Secondary | ICD-10-CM | POA: Insufficient documentation

## 2017-08-19 DIAGNOSIS — Z3A28 28 weeks gestation of pregnancy: Secondary | ICD-10-CM

## 2017-08-19 DIAGNOSIS — O09522 Supervision of elderly multigravida, second trimester: Secondary | ICD-10-CM

## 2017-08-19 DIAGNOSIS — O99210 Obesity complicating pregnancy, unspecified trimester: Secondary | ICD-10-CM

## 2017-08-19 NOTE — Progress Notes (Signed)
   PRENATAL VISIT NOTE  Subjective:  Nancy Jacobs is a 36 y.o. G3P2000 at [redacted]w[redacted]d being seen today for ongoing prenatal care.  She is currently monitored for the following issues for this high-risk pregnancy and has Supervision of other normal pregnancy, antepartum; Obesity in pregnancy; and AMA (advanced maternal age) multigravida 35+ on her problem list.  Patient reports no complaints.  Contractions: Not present. Vag. Bleeding: None.  Movement: Present. Denies leaking of fluid.   The following portions of the patient's history were reviewed and updated as appropriate: allergies, current medications, past family history, past medical history, past social history, past surgical history and problem list. Problem list updated.  Objective:   Vitals:   08/19/17 0857  BP: 123/75  Pulse: 71  Weight: 239 lb (108.4 kg)    Fetal Status: Fetal Heart Rate (bpm): 135 Fundal Height: 28 cm Movement: Present     General:  Alert, oriented and cooperative. Patient is in no acute distress.  Skin: Skin is warm and dry. No rash noted.   Cardiovascular: Normal heart rate noted  Respiratory: Normal respiratory effort, no problems with respiration noted  Abdomen: Soft, gravid, appropriate for gestational age.  Pain/Pressure: Absent     Pelvic: Cervical exam deferred        Extremities: Normal range of motion.  Edema: None  Mental Status:  Normal mood and affect. Normal behavior. Normal judgment and thought content.   Assessment and Plan:  Pregnancy: G3P2000 at [redacted]w[redacted]d  1. Prenatal care in second trimester FHT and FH normal. Repeat US today - Glucose Tolerance, 2 Hours w/1 Hour - CBC - RPR - HIV antibody (with reflex)  2. Elderly multigravida in second trimester NIPS low risk.  3. Obesity in pregnancy   Preterm labor symptoms and general obstetric precautions including but not limited to vaginal bleeding, contractions, leaking of fluid and fetal movement were reviewed in detail with the  patient. Please refer to After Visit Summary for other counseling recommendations.  No Follow-up on file.   Levie Heritage, DO

## 2017-08-19 NOTE — Progress Notes (Signed)
Patient completed [facility] pregnancy risk screen today. Armandina Stammer RNBSN

## 2017-08-20 ENCOUNTER — Telehealth: Payer: Self-pay

## 2017-08-20 ENCOUNTER — Other Ambulatory Visit: Payer: Self-pay | Admitting: Family Medicine

## 2017-08-20 LAB — CBC
Hematocrit: 29.4 % — ABNORMAL LOW (ref 34.0–46.6)
Hemoglobin: 9.5 g/dL — ABNORMAL LOW (ref 11.1–15.9)
MCH: 30.1 pg (ref 26.6–33.0)
MCHC: 32.3 g/dL (ref 31.5–35.7)
MCV: 93 fL (ref 79–97)
PLATELETS: 390 10*3/uL — AB (ref 150–379)
RBC: 3.16 x10E6/uL — AB (ref 3.77–5.28)
RDW: 13.4 % (ref 12.3–15.4)
WBC: 11.7 10*3/uL — AB (ref 3.4–10.8)

## 2017-08-20 LAB — HIV ANTIBODY (ROUTINE TESTING W REFLEX): HIV Screen 4th Generation wRfx: NONREACTIVE

## 2017-08-20 LAB — GLUCOSE TOLERANCE, 2 HOURS W/ 1HR
GLUCOSE, 1 HOUR: 83 mg/dL (ref 65–179)
GLUCOSE, FASTING: 87 mg/dL (ref 65–91)
Glucose, 2 hour: 115 mg/dL (ref 65–152)

## 2017-08-20 LAB — RPR: RPR: NONREACTIVE

## 2017-08-20 MED ORDER — FERROUS SULFATE 325 (65 FE) MG PO TABS: 325.0000 mg | ORAL_TABLET | Freq: Three times a day (TID) | ORAL | 2 refills | Status: DC

## 2017-08-20 NOTE — Telephone Encounter (Signed)
-----   Message from Levie Heritage, DO sent at 08/20/2017  8:17 AM EDT ----- Patient anemic - Iron TID prescribed. Please let pt know. 2hr GTT normal.

## 2017-08-20 NOTE — Telephone Encounter (Signed)
Patient called and made aware that 2 hr gtt is normal. Patient made aware that she is anemic and that her iron prescription was sent to her pharmacy to try and take three times a day. Armandina Stammer RNBSN

## 2017-08-26 ENCOUNTER — Inpatient Hospital Stay (HOSPITAL_BASED_OUTPATIENT_CLINIC_OR_DEPARTMENT_OTHER)
Admission: EM | Admit: 2017-08-26 | Discharge: 2017-08-26 | Disposition: A | Discharge status: Home / Self Care | Payer: Medicaid Other | Attending: Obstetrics & Gynecology | Admitting: Obstetrics & Gynecology

## 2017-08-26 ENCOUNTER — Encounter (HOSPITAL_BASED_OUTPATIENT_CLINIC_OR_DEPARTMENT_OTHER): Payer: Self-pay | Admitting: Emergency Medicine

## 2017-08-26 DIAGNOSIS — O26853 Spotting complicating pregnancy, third trimester: Secondary | ICD-10-CM

## 2017-08-26 DIAGNOSIS — Z87891 Personal history of nicotine dependence: Secondary | ICD-10-CM | POA: Diagnosis not present

## 2017-08-26 DIAGNOSIS — R1030 Lower abdominal pain, unspecified: Secondary | ICD-10-CM | POA: Diagnosis present

## 2017-08-26 DIAGNOSIS — O4693 Antepartum hemorrhage, unspecified, third trimester: Secondary | ICD-10-CM | POA: Insufficient documentation

## 2017-08-26 DIAGNOSIS — O99210 Obesity complicating pregnancy, unspecified trimester: Secondary | ICD-10-CM

## 2017-08-26 DIAGNOSIS — N939 Abnormal uterine and vaginal bleeding, unspecified: Secondary | ICD-10-CM

## 2017-08-26 DIAGNOSIS — Z3689 Encounter for other specified antenatal screening: Secondary | ICD-10-CM

## 2017-08-26 DIAGNOSIS — O26893 Other specified pregnancy related conditions, third trimester: Secondary | ICD-10-CM | POA: Insufficient documentation

## 2017-08-26 DIAGNOSIS — S39011A Strain of muscle, fascia and tendon of abdomen, initial encounter: Secondary | ICD-10-CM

## 2017-08-26 DIAGNOSIS — O09522 Supervision of elderly multigravida, second trimester: Secondary | ICD-10-CM

## 2017-08-26 DIAGNOSIS — Z3480 Encounter for supervision of other normal pregnancy, unspecified trimester: Secondary | ICD-10-CM

## 2017-08-26 DIAGNOSIS — Z3A29 29 weeks gestation of pregnancy: Secondary | ICD-10-CM | POA: Diagnosis not present

## 2017-08-26 DIAGNOSIS — O9989 Other specified diseases and conditions complicating pregnancy, childbirth and the puerperium: Secondary | ICD-10-CM

## 2017-08-26 LAB — URINALYSIS, MICROSCOPIC (REFLEX)

## 2017-08-26 LAB — URINALYSIS, ROUTINE W REFLEX MICROSCOPIC
Bilirubin Urine: NEGATIVE
GLUCOSE, UA: NEGATIVE mg/dL
KETONES UR: NEGATIVE mg/dL
LEUKOCYTES UA: NEGATIVE
Nitrite: NEGATIVE
PROTEIN: NEGATIVE mg/dL
Specific Gravity, Urine: 1.02 (ref 1.005–1.030)
pH: 6 (ref 5.0–8.0)

## 2017-08-26 LAB — WET PREP, GENITAL
Clue Cells Wet Prep HPF POC: NONE SEEN
SPERM: NONE SEEN
Trich, Wet Prep: NONE SEEN
Yeast Wet Prep HPF POC: NONE SEEN

## 2017-08-26 NOTE — ED Provider Notes (Signed)
[facility] Note   CSN: [id number] Arrival date & time: 08/26/17  4696     History   Chief Complaint Chief Complaint  Patient presents with  . Vaginal Bleeding    HPI Nancy Jacobs is a 36 y.o. female.  The history is provided by the patient and medical records.  Vaginal Bleeding  Primary symptoms include pelvic pain, vaginal bleeding.  Primary symptoms include no discharge, no dysuria. There has been no fever. This is a new problem. The current episode started 3 to 5 hours ago. The problem occurs constantly. The problem has been gradually worsening. She is pregnant. Associated symptoms include abdominal pain. Pertinent negatives include no constipation, no diarrhea, no nausea, no vomiting, no frequency and no light-headedness. She has tried nothing for the symptoms. The treatment provided no relief.  Abdominal Pain   This is a new problem. The current episode started 3 to 5 hours ago. The problem occurs constantly. The problem has been gradually worsening. The pain is associated with an unknown (lifted heady box) factor. The pain is located in the suprapubic region. The quality of the pain is aching and cramping. The pain is at a severity of 8/10. The pain is severe. Pertinent negatives include fever, diarrhea, melena, nausea, vomiting, constipation, dysuria, frequency, hematuria and headaches. The symptoms are aggravated by palpation. Nothing relieves the symptoms.    Past Medical History:  Diagnosis Date  . Vaginal Pap smear, abnormal     Patient Active Problem List   Diagnosis Date Noted  . Supervision of other normal pregnancy, antepartum 06/18/2017  . Obesity in pregnancy 06/18/2017  . AMA (advanced maternal age) multigravida 35+ 06/18/2017    Past Surgical History:  Procedure Laterality Date  . KNEE CARTILAGE SURGERY    . LEG SURGERY      OB History    Gravida Para Term Preterm AB Living   3 2 2          SAB TAB Ectopic Multiple Live Births             2       Home Medications    Prior to Admission medications   Medication Sig Start Date End Date Taking? Authorizing Provider  cephALEXin (KEFLEX) 500 MG capsule Take 1 capsule (500 mg total) by mouth 3 (three) times daily. 06/24/17   Rolland Porter, MD  Doxylamine-Pyridoxine 10-10 MG TBEC Take by mouth. 04/16/17   [provider]  ferrous sulfate (FERROUSUL) 325 (65 FE) MG tablet Take 1 tablet (325 mg total) by mouth 3 (three) times daily with meals. 08/20/17   Levie Heritage, DO    Family History Family History  Problem Relation Age of Onset  . Cancer Mother        liver  . Hypertension Mother   . Hypertension Father   . Hypertension Sister   . Cancer Maternal Aunt   . Cancer Maternal Grandmother   . Diabetes Maternal Grandfather     Social History Social History  Substance Use Topics  . Smoking status: Former Smoker    Packs/day: 0.50  . Smokeless tobacco: Never Used  . Alcohol use Yes     Comment: occasional     Allergies   Patient has no known allergies.   Review of Systems Review of Systems  Constitutional: Negative for chills, fatigue and fever.  HENT: Negative for congestion.   Respiratory: Negative for cough, chest tightness, shortness of breath, wheezing and stridor.   Cardiovascular: Negative for chest pain,  palpitations and leg swelling.  Gastrointestinal: Positive for abdominal pain. Negative for constipation, diarrhea, melena, nausea and vomiting.  Genitourinary: Positive for pelvic pain and vaginal bleeding. Negative for decreased urine volume, dysuria, frequency, hematuria, urgency, vaginal discharge and vaginal pain.  Musculoskeletal: Negative for back pain and neck pain.  Skin: Negative for rash and wound.  Neurological: Negative for light-headedness and headaches.  Psychiatric/Behavioral: Negative for agitation.  All other systems reviewed and are negative.    Physical Exam Updated Vital Signs BP (!) 152/93 (BP Location:  Left Arm)   Pulse 86   Temp 98.5 F (36.9 C) (Oral)   Resp 20   Ht 5' 3.5" (1.613 m)   Wt 108.4 kg (239 lb)   LMP 02/03/2017 (Exact Date)   SpO2 100%   BMI 41.67 kg/m   Physical Exam  Constitutional: She is oriented to person, place, and time. She appears well-developed and well-nourished. No distress.  HENT:  Head: Normocephalic and atraumatic.  Mouth/Throat: Oropharynx is clear and moist.  Eyes: Pupils are equal, round, and reactive to light. Right eye exhibits no discharge.  Cardiovascular: Normal rate and intact distal pulses.   No murmur heard. Pulmonary/Chest: Effort normal. No stridor. She has no wheezes. She exhibits no tenderness.  Abdominal: Soft. She exhibits distension (gravid). There is tenderness in the suprapubic area. There is no CVA tenderness.    Musculoskeletal: She exhibits no edema or tenderness.  Neurological: She is alert and oriented to person, place, and time. No sensory deficit. She exhibits normal muscle tone.  Skin: Capillary refill takes less than 2 seconds. No rash noted. She is not diaphoretic. No erythema.  Psychiatric: Her behavior is normal.  Nursing note and vitals reviewed.    ED Treatments / Results  Labs (all labs ordered are listed, but only abnormal results are displayed) Labs Reviewed  WET PREP, GENITAL - Abnormal; Notable for the following:       Result Value   WBC, Wet Prep HPF POC FEW (*)    All other components within normal limits  URINALYSIS, ROUTINE W REFLEX MICROSCOPIC - Abnormal; Notable for the following:    APPearance HAZY (*)    Hgb urine dipstick SMALL (*)    All other components within normal limits  URINALYSIS, MICROSCOPIC (REFLEX) - Abnormal; Notable for the following:    Bacteria, UA FEW (*)    Squamous Epithelial / LPF 0-5 (*)    All other components within normal limits  GC/CHLAMYDIA PROBE AMP () NOT AT [facility]    EKG  EKG Interpretation None       Radiology No results  found.  Procedures Procedures (including critical care time)  EMERGENCY DEPARTMENT [place] PREGNANCY "Study: Limited Ultrasound of the Pelvis for Pregnancy"  INDICATIONS:Pregnancy(required), Vaginal bleeding and Abdominal or pelvic pain Multiple views of the uterus and pelvic cavity were obtained in real-time with a multi-frequency probe.  APPROACH:Transabdominal  PERFORMED BY: Myself IMAGES ARCHIVED?: Yes LIMITATIONS: Emergent procedure PREGNANCY FREE FLUID: Present ADNEXAL FINDINGS:Not assessed GESTATIONAL AGE, ESTIMATE: not assessed FETAL HEART RATE: 136 INTERPRETATION: Fetal heart activity seen   Medications Ordered in ED Medications - No data to display   Initial Impression / Assessment and Plan / ED Course  I have reviewed the triage vital signs and the nursing notes.  Pertinent labs & imaging results that were available during my care of the patient were reviewed by me and considered in my medical decision making (see chart for details).     Pegah Segel is a  36 y.o. female who is 4469w1d pregnant who presents with abdominal pain and vaginal bleeding. Patient reports that she was normal health until this morning at work. She says that she was lifting approximate 60 pound boxes for 3 hours when she developed lower abdominal cramping and pain. She describes the pain as gradually worsening up to an 8 out of 10 severity on arrival. She reports that she felt a witness in her groin and when she wiped found that she was having bright red bleeding. It was a small amount. She says that she has had no lightheadedness or syncopal episodes. She denies any chest pain, shortness of breath, headache, or fatigue. She has not taken any medicine for her symptoms. She denies any other abdominal traumas. She denied any preceding symptoms over the last few days.  On arrival, patient was examined. Patient's lungs are clear. Chest is nontender. Abdomen is tender in the lower abdomen. Bedside ultrasound  was performed confirming fetal heart tones. Fetal heart rate was approximate 136 on M-mode imaging. Exam was otherwise unremarkable.  OB/GYN team was quickly called. They [facility] for further evaluation. They did not recommend any administration of medications or obtaining blood as this would possibly delay OB/GYN evaluation.  Chart review shows that patient has what type of O+ thus, will not need Rhogam.   [facility] will [facility] to [facility] for further management.      Final Clinical Impressions(s) / ED Diagnoses   Final diagnoses:  Vaginal bleeding  [redacted] weeks gestation of pregnancy  Lower abdominal pain  Spotting affecting pregnancy in third trimester  NST (non-stress test) reactive     Clinical Impression: 1. Vaginal bleeding   2. [redacted] weeks gestation of pregnancy   3. Lower abdominal pain   4. Supervision of other normal pregnancy, antepartum   5. Obesity in pregnancy   6. Elderly multigravida in second trimester   7. Spotting affecting pregnancy in third trimester   8. NST (non-stress test) reactive   9. Strain of abdominal muscle, initial encounter     Dis[facility]: [facility]    Tegeler, Canary Brimhristopher J, MD 08/26/17 2023

## 2017-08-26 NOTE — ED Notes (Signed)
Spoke w/ Denny PeonErin w/ [facility]; she is able to see pt/baby monitor information and will call back when she has enough information to clear.

## 2017-08-26 NOTE — MAU Note (Signed)
Urine in lab 

## 2017-08-26 NOTE — Progress Notes (Addendum)
[id number] Received call about this 36 yo G3P0 @ 29.[redacted] wks GA in with c/o vaginal bleeding following lifting a heavy object.  1005 [facility] RN requested that ED RNs adjust EFM to find FHR, suggestions given. 1045 Awaiting return call from Dr. Sreeja Spies FullingHarraway-Smith regarding this pt.1141 Per Asencion IslamMarva, RN [facility] ED, pt is being transferred via [facility] to [facility].

## 2017-08-26 NOTE — MAU Note (Signed)
Pt arrived [facility], was not aware pt was being transferred.

## 2017-08-26 NOTE — ED Triage Notes (Signed)
Pt c/o small amt vaginal bleeding when wiping after lifting heavy boxes this a.m.; reports lower abd pain

## 2017-08-26 NOTE — Discharge Instructions (Signed)
Vaginal Bleeding During Pregnancy, Third Trimester A small amount of bleeding (spotting) from the vagina is relatively common in pregnancy. Various things can cause bleeding or spotting in pregnancy. Sometimes the bleeding is normal and is not a problem. However, bleeding during the third trimester can also be a sign of something serious for the mother and the baby. Be sure to tell your health care provider about any vaginal bleeding right away. Some possible causes of vaginal bleeding during the third trimester include:  The placenta may be partially or completely covering the opening to the cervix (placenta previa).  The placenta may have separated from the uterus (abruption of the placenta).  There may be an infection or growth on the cervix.  You may be starting labor, called discharging of the mucus plug.  The placenta may grow into the muscle layer of the uterus (placenta accreta).  Follow these instructions at home: Watch your condition for any changes. The following actions may help to lessen any discomfort you are feeling:  Follow your health care provider's instructions for limiting your activity. If your health care provider orders bed rest, you may need to stay in bed and only get up to use the bathroom. However, your health care provider may allow you to continue light activity.  If needed, make plans for someone to help with your regular activities and responsibilities while you are on bed rest.  Keep track of the number of pads you use each day, how often you change pads, and how soaked (saturated) they are. Write this down.  Do not use tampons. Do not douche.  Do not have sexual intercourse or orgasms until approved by your health care provider.  Follow your health care provider's advice about lifting, driving, and physical activities.  If you pass any tissue from your vagina, save the tissue so you can show it to your health care provider.  Only take over-the-counter  or prescription medicines as directed by your health care provider.  Do not take aspirin because it can make you bleed.  Keep all follow-up appointments as directed by your health care provider.  Contact a health care provider if:  You have any vaginal bleeding during any part of your pregnancy.  You have cramps or labor pains.  You have a fever, not controlled by medicine. Get help right away if:  You have severe cramps or pain in your back or belly (abdomen).  You have chills.  You have a gush of fluid from the vagina.  You pass large clots or tissue from your vagina.  Your bleeding increases.  You feel light-headed or weak.  You pass out.  You feel less movement or no movement of the baby. This information is not intended to replace advice given to you by your health care provider. Make sure you discuss any questions you have with your health care provider. Document Released: 03/06/2003 Document Revised: 05/21/2016 Document Reviewed: 08/21/2013 Elsevier Interactive Patient Education  2018 [employer] of the uterus can occur throughout pregnancy, but they are not always a sign that you are in labor. You may have practice contractions called Braxton Hicks contractions. These false labor contractions are sometimes confused with true labor. What are Deberah Pelton contractions? Braxton Hicks contractions are tightening movements that occur in the muscles of the uterus before labor. Unlike true labor contractions, these contractions do not result in opening (dilation) and thinning of the cervix. Toward the end of pregnancy (32-34 weeks), Deberah Pelton  contractions can happen more often and may become stronger. These contractions are sometimes difficult to tell apart from true labor because they can be very uncomfortable. You should not feel embarrassed if you go to the hospital with false labor. Sometimes, the only way to tell if you are in  true labor is for your health care provider to look for changes in the cervix. The health care provider will do a physical exam and may monitor your contractions. If you are not in true labor, the exam should show that your cervix is not dilating and your water has not broken. If there are no prenatal problems or other health problems associated with your pregnancy, it is completely safe for you to be sent home with false labor. You may continue to have Braxton Hicks contractions until you go into true labor. How can I tell the difference between true labor and false labor?  Differences ? False labor ? Contractions last 30-70 seconds.: Contractions are usually shorter and not as strong as true labor contractions. ? Contractions become very regular.: Contractions are usually irregular. ? Discomfort is usually felt in the top of the uterus, and it spreads to the lower abdomen and low back.: Contractions are often felt in the front of the lower abdomen and in the groin. ? Contractions do not go away with walking.: Contractions may go away when you walk around or change positions while lying down. ? Contractions usually become more intense and increase in frequency.: Contractions get weaker and are shorter-lasting as time goes on. ? The cervix dilates and gets thinner.: The cervix usually does not dilate or become thin. Follow these instructions at home:  Take over-the-counter and prescription medicines only as told by your health care provider.  Keep up with your usual exercises and follow other instructions from your health care provider.  Eat and drink lightly if you think you are going into labor.  If Braxton Hicks contractions are making you uncomfortable: ? Change your position from lying down or resting to walking, or change from walking to resting. ? Sit and rest in a tub of warm water. ? Drink enough fluid to keep your urine clear or pale yellow. Dehydration may cause these  contractions. ? Do slow and deep breathing several times an hour.  Keep all follow-up prenatal visits as told by your health care provider. This is important. Contact a health care provider if:  You have a fever.  You have continuous pain in your abdomen. Get help right away if:  Your contractions become stronger, more regular, and closer together.  You have fluid leaking or gushing from your vagina.  You pass blood-tinged mucus (bloody show).  You have bleeding from your vagina.  You have low back pain that you never had before.  You feel your babys head pushing down and causing pelvic pressure.  Your baby is not moving inside you as much as it used to. Summary  Contractions that occur before labor are called Braxton Hicks contractions, false labor, or practice contractions.  Braxton Hicks contractions are usually shorter, weaker, farther apart, and less regular than true labor contractions. True labor contractions usually become progressively stronger and regular and they become more frequent.  Manage discomfort from [facility] contractions by changing position, resting in a warm bath, drinking plenty of water, or practicing deep breathing. This information is not intended to replace advice given to you by your health care provider. Make sure you discuss any questions you have with  your health care provider. Document Released: 12/14/2005 Document Revised: 11/02/2016 Document Reviewed: 11/02/2016 Elsevier Interactive Patient Education  2017 [employer]

## 2017-08-26 NOTE — MAU Provider Note (Signed)
History     CSN: [id number]  Arrival date and time: 08/26/17 1214   First Provider Initiated Contact with Patient 08/26/17 1238      Chief Complaint  Patient presents with  . Vaginal Bleeding   G3P2002 @29 .1 weeks sent from [facility] [place] for VB. Pt reports seeing red blood on the toilet paper this am. She reports prior to this she was lifting heavy objects at work that were 50-60 lbs. She also reports constant lower abdominal pain that started after the lifting. Rates 7/10.  Has not taken anything for the pain. Denies LOF, vaginal discharge, and ctx. Good FM. Last IC 2 days ago. She quit her job this am.    OB History    Gravida Para Term Preterm AB Living   3 2 2          SAB TAB Ectopic Multiple Live Births           2      Past Medical History:  Diagnosis Date  . Vaginal Pap smear, abnormal     Past Surgical History:  Procedure Laterality Date  . KNEE CARTILAGE SURGERY    . LEG SURGERY      Family History  Problem Relation Age of Onset  . Cancer Mother        liver  . Hypertension Mother   . Hypertension Father   . Hypertension Sister   . Cancer Maternal Aunt   . Cancer Maternal Grandmother   . Diabetes Maternal Grandfather     Social History  Substance Use Topics  . Smoking status: Former Smoker    Packs/day: 0.50  . Smokeless tobacco: Never Used  . Alcohol use Yes     Comment: occasional    Allergies: No Known Allergies  Prescriptions Prior to Admission  Medication Sig Dispense Refill Last Dose  . cephALEXin (KEFLEX) 500 MG capsule Take 1 capsule (500 mg total) by mouth 3 (three) times daily. 9 capsule 0   . Doxylamine-Pyridoxine 10-10 MG TBEC Take by mouth.   Taking  . ferrous sulfate (FERROUSUL) 325 (65 FE) MG tablet Take 1 tablet (325 mg total) by mouth 3 (three) times daily with meals. 90 tablet 2     Review of Systems  Gastrointestinal: Positive for abdominal pain.  Genitourinary: Positive for vaginal bleeding. Negative for vaginal  discharge.   Physical Exam   Blood pressure 124/82, pulse 78, temperature 98.3 F (36.8 C), temperature source Oral, resp. rate 16, height 5' 3.5" (1.613 m), weight 239 lb (108.4 kg), last menstrual period 02/03/2017, SpO2 100 %.  Physical Exam  Constitutional: She is oriented to person, place, and time. She appears well-developed and well-nourished. No distress.  HENT:  Head: Normocephalic and atraumatic.  Neck: Normal range of motion.  Cardiovascular: Normal rate.   Respiratory: Effort normal. No respiratory distress.  GI: Soft. She exhibits no distension. There is no tenderness.  gravid  Genitourinary:  Genitourinary Comments: External: no lesions or erythema Vagina: rugated, pink, moist, thin white discharge, no blood SVE closed/thick   Musculoskeletal: Normal range of motion.  Neurological: She is alert and oriented to person, place, and time.  Skin: Skin is warm and dry.  Psychiatric: She has a normal mood and affect.  EFM: 135 bpm, mod variability, + accels, no decels Toco: none  Results for orders placed or performed during the hospital encounter of 08/26/17 (from the past 24 hour(s))  Urinalysis, Routine w reflex microscopic     Status: Abnormal   Collection Time:  08/26/17  9:39 AM  Result Value Ref Range   Color, Urine YELLOW YELLOW   APPearance HAZY (A) CLEAR   Specific Gravity, Urine 1.020 1.005 - 1.030   pH 6.0 5.0 - 8.0   Glucose, UA NEGATIVE NEGATIVE mg/dL   Hgb urine dipstick SMALL (A) NEGATIVE   Bilirubin Urine NEGATIVE NEGATIVE   Ketones, ur NEGATIVE NEGATIVE mg/dL   Protein, ur NEGATIVE NEGATIVE mg/dL   Nitrite NEGATIVE NEGATIVE   Leukocytes, UA NEGATIVE NEGATIVE  Urinalysis, Microscopic (reflex)     Status: Abnormal   Collection Time: 08/26/17  9:39 AM  Result Value Ref Range   RBC / HPF 0-5 0 - 5 RBC/hpf   WBC, UA 0-5 0 - 5 WBC/hpf   Bacteria, UA FEW (A) NONE SEEN   Squamous Epithelial / LPF 0-5 (A) NONE SEEN  Wet prep, genital     Status:  Abnormal   Collection Time: 08/26/17 12:47 PM  Result Value Ref Range   Yeast Wet Prep HPF POC NONE SEEN NONE SEEN   Trich, Wet Prep NONE SEEN NONE SEEN   Clue Cells Wet Prep HPF POC NONE SEEN NONE SEEN   WBC, Wet Prep HPF POC FEW (A) NONE SEEN   Sperm NONE SEEN    MAU Course  Procedures  MDM Labs ordered and reviewed. No evidence of abruption or PTL, FHT reassurring. Pain likely MSK strain from lifting. Stable for discharge home.  Assessment and Plan  [redacted] weeks gestation Spotting in pregnancy MSK abdominal pain Reactive NST  Discharge home PTL/abruption precautions FMCs Follow up in Ob office as scheduled Tylenol, heating pad prn Weight restriction of 20 lbs  Allergies as of 08/26/2017   No Known Allergies     Medication List    STOP taking these medications   cephALEXin 500 MG capsule Commonly known as:  KEFLEX     TAKE these medications   Doxylamine-Pyridoxine 10-10 MG Tbec Take by mouth.   ferrous sulfate 325 (65 FE) MG tablet Commonly known as:  FERROUSUL Take 1 tablet (325 mg total) by mouth 3 (three) times daily with meals.            Discharge Care Instructions        Start     Ordered   08/26/17 0000  Discharge patient    Question Answer Comment  Discharge disposition 01-Home or Self Care   Discharge patient date 08/26/2017      08/26/17 [address]     Falcon Mccaskey, [place] 08/26/2017, 12:48 PM

## 2017-08-26 NOTE — MAU Note (Addendum)
Pt was at work lifting boxes, had abdominal pain 7/10 and vaginal bleeding. States the bleeding was only when she wiped and now it stopped. Good fetal movement.

## 2017-08-27 LAB — GC/CHLAMYDIA PROBE AMP (~~LOC~~) NOT AT ARMC
CHLAMYDIA, DNA PROBE: NEGATIVE
Neisseria Gonorrhea: NEGATIVE

## 2017-09-03 ENCOUNTER — Ambulatory Visit (INDEPENDENT_AMBULATORY_CARE_PROVIDER_SITE_OTHER): Payer: Medicaid Other | Admitting: Family Medicine

## 2017-09-03 VITALS — BP 135/80 | HR 77 | Wt 235.0 lb

## 2017-09-03 DIAGNOSIS — O99213 Obesity complicating pregnancy, third trimester: Secondary | ICD-10-CM

## 2017-09-03 DIAGNOSIS — O99210 Obesity complicating pregnancy, unspecified trimester: Secondary | ICD-10-CM

## 2017-09-03 DIAGNOSIS — Z3483 Encounter for supervision of other normal pregnancy, third trimester: Secondary | ICD-10-CM

## 2017-09-03 DIAGNOSIS — E669 Obesity, unspecified: Secondary | ICD-10-CM

## 2017-09-03 DIAGNOSIS — O09523 Supervision of elderly multigravida, third trimester: Secondary | ICD-10-CM

## 2017-09-03 DIAGNOSIS — Z3480 Encounter for supervision of other normal pregnancy, unspecified trimester: Secondary | ICD-10-CM

## 2017-09-03 NOTE — Progress Notes (Signed)
   PRENATAL VISIT NOTE  Subjective:  Nancy Jacobs is a 36 y.o. G3P2000 at 1244w2d being seen today for ongoing prenatal care.  She is currently monitored for the following issues for this high-risk pregnancy and has Supervision of other normal pregnancy, antepartum; Obesity in pregnancy; and AMA (advanced maternal age) multigravida 35+ on her problem list.  Patient reports occasional contractions.  Contractions: Irritability. Vag. Bleeding: None.  Movement: Present. Denies leaking of fluid.   The following portions of the patient's history were reviewed and updated as appropriate: allergies, current medications, past family history, past medical history, past social history, past surgical history and problem list. Problem list updated.  Objective:   Vitals:   09/03/17 0931  BP: 135/80  Pulse: 77  Weight: 235 lb (106.6 kg)    Fetal Status:     Movement: Present     General:  Alert, oriented and cooperative. Patient is in no acute distress.  Skin: Skin is warm and dry. No rash noted.   Cardiovascular: Normal heart rate noted  Respiratory: Normal respiratory effort, no problems with respiration noted  Abdomen: Soft, gravid, appropriate for gestational age.  Pain/Pressure: Present     Pelvic: Cervical exam deferred        Extremities: Normal range of motion.  Edema: None  Mental Status:  Normal mood and affect. Normal behavior. Normal judgment and thought content.   Assessment and Plan:  Pregnancy: G3P2000 at 2044w2d  1. Supervision of other normal pregnancy, antepartum FHT and FH normal  2. Obesity in pregnancy Appropriate weight gain  3. Elderly multigravida in third trimester No additional testing  Preterm labor symptoms and general obstetric precautions including but not limited to vaginal bleeding, contractions, leaking of fluid and fetal movement were reviewed in detail with the patient. Please refer to After Visit Summary for other counseling recommendations.  Return in  about 2 weeks (around 09/17/2017) for OB f/u.   Levie HeritageJacob J Shavonte Zhao, DO

## 2017-09-21 ENCOUNTER — Ambulatory Visit (INDEPENDENT_AMBULATORY_CARE_PROVIDER_SITE_OTHER): Payer: Medicaid Other | Admitting: Advanced Practice Midwife

## 2017-09-21 VITALS — BP 139/80 | HR 85 | Wt 243.0 lb

## 2017-09-21 DIAGNOSIS — Z3480 Encounter for supervision of other normal pregnancy, unspecified trimester: Secondary | ICD-10-CM

## 2017-09-21 DIAGNOSIS — O09523 Supervision of elderly multigravida, third trimester: Secondary | ICD-10-CM

## 2017-09-21 NOTE — Progress Notes (Signed)
   PRENATAL VISIT NOTE  Subjective:  Nancy Jacobs is a 36 y.o. G3P2000 at [redacted]w[redacted]d being seen today for ongoing prenatal care.  She is currently monitored for the following issues for this low-risk pregnancy and has Supervision of other normal pregnancy, antepartum; Obesity in pregnancy; and AMA (advanced maternal age) multigravida 35+ on her problem list.  Patient reports occasional contractions.  Contractions: Irritability. Vag. Bleeding: None.  Movement: Present. Denies leaking of fluid.   The following portions of the patient's history were reviewed and updated as appropriate: allergies, current medications, past family history, past medical history, past social history, past surgical history and problem list. Problem list updated.  Objective:   Vitals:   09/21/17 0931  BP: 139/80  Pulse: 85  Weight: 243 lb (110.2 kg)    Fetal Status: Fetal Heart Rate (bpm): 140   Movement: Present     General:  Alert, oriented and cooperative. Patient is in no acute distress.  Skin: Skin is warm and dry. No rash noted.   Cardiovascular: Normal heart rate noted  Respiratory: Normal respiratory effort, no problems with respiration noted  Abdomen: Soft, gravid, appropriate for gestational age.  Pain/Pressure: Present     Pelvic: Cervical exam deferred        Extremities: Normal range of motion.  Edema: None  Mental Status:  Normal mood and affect. Normal behavior. Normal judgment and thought content.   Assessment and Plan:  Pregnancy: G3P2000 at [redacted]w[redacted]d  1. Elderly multigravida in third trimester      Occasional UCs  2. Supervision of other normal pregnancy, antepartum      Declines flu and TDAP shots      Undecided re: BTL. Did sign papers just in case  Preterm labor symptoms and general obstetric precautions including but not limited to vaginal bleeding, contractions, leaking of fluid and fetal movement were reviewed in detail with the patient. Please refer to After Visit Summary for other  counseling recommendations.  Return in about 2 weeks (around 10/05/2017) for [employer].   Wynelle Bourgeois, CNM

## 2017-09-21 NOTE — Patient Instructions (Signed)
Third Trimester of Pregnancy The third trimester is from week 28 through week 40 (months 7 through 9). The third trimester is a time when the unborn baby (fetus) is growing rapidly. At the end of the ninth month, the fetus is about 20 inches in length and weighs 6-10 pounds. Body changes during your third trimester Your body will continue to go through many changes during pregnancy. The changes vary from woman to woman. During the third trimester:  Your weight will continue to increase. You can expect to gain 25-35 pounds (11-16 kg) by the end of the pregnancy.  You may begin to get stretch marks on your hips, abdomen, and breasts.  You may urinate more often because the fetus is moving lower into your pelvis and pressing on your bladder.  You may develop or continue to have heartburn. This is caused by increased hormones that slow down muscles in the digestive tract.  You may develop or continue to have constipation because increased hormones slow digestion and cause the muscles that push waste through your intestines to relax.  You may develop hemorrhoids. These are swollen veins (varicose veins) in the rectum that can itch or be painful.  You may develop swollen, bulging veins (varicose veins) in your legs.  You may have increased body aches in the pelvis, back, or thighs. This is due to weight gain and increased hormones that are relaxing your joints.  You may have changes in your hair. These can include thickening of your hair, rapid growth, and changes in texture. Some women also have hair loss during or after pregnancy, or hair that feels dry or thin. Your hair will most likely return to normal after your baby is born.  Your breasts will continue to grow and they will continue to become tender. A yellow fluid (colostrum) may leak from your breasts. This is the first milk you are producing for your baby.  Your belly button may stick out.  You may notice more swelling in your hands,  face, or ankles.  You may have increased tingling or numbness in your hands, arms, and legs. The skin on your belly may also feel numb.  You may feel short of breath because of your expanding uterus.  You may have more problems sleeping. This can be caused by the size of your belly, increased need to urinate, and an increase in your body's metabolism.  You may notice the fetus "dropping," or moving lower in your abdomen (lightening).  You may have increased vaginal discharge.  You may notice your joints feel loose and you may have pain around your pelvic bone.  What to expect at prenatal visits You will have prenatal exams every 2 weeks until week 36. Then you will have weekly prenatal exams. During a routine prenatal visit:  You will be weighed to make sure you and the baby are growing normally.  Your blood pressure will be taken.  Your abdomen will be measured to track your baby's growth.  The fetal heartbeat will be listened to.  Any test results from the previous visit will be discussed.  You may have a cervical check near your due date to see if your cervix has softened or thinned (effaced).  You will be tested for Group B streptococcus. This happens between 35 and 37 weeks.  Your health care provider may ask you:  What your birth plan is.  How you are feeling.  If you are feeling the baby move.  If you have had   any abnormal symptoms, such as leaking fluid, bleeding, severe headaches, or abdominal cramping.  If you are using any tobacco products, including cigarettes, chewing tobacco, and electronic cigarettes.  If you have any questions.  Other tests or screenings that may be performed during your third trimester include:  Blood tests that check for low iron levels (anemia).  Fetal testing to check the health, activity level, and growth of the fetus. Testing is done if you have certain medical conditions or if there are problems during the  pregnancy.  Nonstress test (NST). This test checks the health of your baby to make sure there are no signs of problems, such as the baby not getting enough oxygen. During this test, a belt is placed around your belly. The baby is made to move, and its heart rate is monitored during movement.  What is false labor? False labor is a condition in which you feel small, irregular tightenings of the muscles in the womb (contractions) that usually go away with rest, changing position, or drinking water. These are called Braxton Hicks contractions. Contractions may last for hours, days, or even weeks before true labor sets in. If contractions come at regular intervals, become more frequent, increase in intensity, or become painful, you should see your health care provider. What are the signs of labor?  Abdominal cramps.  Regular contractions that start at 10 minutes apart and become stronger and more frequent with time.  Contractions that start on the top of the uterus and spread down to the lower abdomen and back.  Increased pelvic pressure and dull back pain.  A watery or bloody mucus discharge that comes from the vagina.  Leaking of amniotic fluid. This is also known as your "water breaking." It could be a slow trickle or a gush. Let your health care provider know if it has a color or strange odor. If you have any of these signs, call your health care provider right away, even if it is before your due date. Follow these instructions at home: Medicines  Follow your health care provider's instructions regarding medicine use. Specific medicines may be either safe or unsafe to take during pregnancy.  Take a prenatal vitamin that contains at least 600 micrograms (mcg) of folic acid.  If you develop constipation, try taking a stool softener if your health care provider approves. Eating and drinking  Eat a balanced diet that includes fresh fruits and vegetables, whole grains, good sources of protein  such as meat, eggs, or tofu, and low-fat dairy. Your health care provider will help you determine the amount of weight gain that is right for you.  Avoid raw meat and uncooked cheese. These carry germs that can cause birth defects in the baby.  If you have low calcium intake from food, talk to your health care provider about whether you should take a daily calcium supplement.  Eat four or five small meals rather than three large meals a day.  Limit foods that are high in fat and processed sugars, such as fried and sweet foods.  To prevent constipation: ? Drink enough fluid to keep your urine clear or pale yellow. ? Eat foods that are high in fiber, such as fresh fruits and vegetables, whole grains, and beans. Activity  Exercise only as directed by your health care provider. Most women can continue their usual exercise routine during pregnancy. Try to exercise for 30 minutes at least 5 days a week. Stop exercising if you experience uterine contractions.  Avoid heavy   lifting.  Do not exercise in extreme heat or humidity, or at high altitudes.  Wear low-heel, comfortable shoes.  Practice good posture.  You may continue to have sex unless your health care provider tells you otherwise. Relieving pain and discomfort  Take frequent breaks and rest with your legs elevated if you have leg cramps or low back pain.  Take warm sitz baths to soothe any pain or discomfort caused by hemorrhoids. Use hemorrhoid cream if your health care provider approves.  Wear a good support bra to prevent discomfort from breast tenderness.  If you develop varicose veins: ? Wear support pantyhose or compression stockings as told by your healthcare provider. ? Elevate your feet for 15 minutes, 3-4 times a day. Prenatal care  Write down your questions. Take them to your prenatal visits.  Keep all your prenatal visits as told by your health care provider. This is important. Safety  Wear your seat belt at  all times when driving.  Make a list of emergency phone numbers, including numbers for family, friends, the hospital, and police and fire departments. General instructions  Avoid cat litter boxes and soil used by cats. These carry germs that can cause birth defects in the baby. If you have a cat, ask someone to clean the litter box for you.  Do not travel far distances unless it is absolutely necessary and only with the approval of your health care provider.  Do not use hot tubs, steam rooms, or saunas.  Do not drink alcohol.  Do not use any products that contain nicotine or tobacco, such as cigarettes and e-cigarettes. If you need help quitting, ask your health care provider.  Do not use any medicinal herbs or unprescribed drugs. These chemicals affect the formation and growth of the baby.  Do not douche or use tampons or scented sanitary pads.  Do not cross your legs for long periods of time.  To prepare for the arrival of your baby: ? Take prenatal classes to understand, practice, and ask questions about labor and delivery. ? Make a trial run to the hospital. ? Visit the hospital and tour the maternity area. ? Arrange for maternity or paternity leave through employers. ? Arrange for family and friends to take care of pets while you are in the hospital. ? Purchase a rear-facing car seat and make sure you know how to install it in your car. ? Pack your hospital bag. ? Prepare the baby's nursery. Make sure to remove all pillows and stuffed animals from the baby's crib to prevent suffocation.  Visit your dentist if you have not gone during your pregnancy. Use a soft toothbrush to brush your teeth and be gentle when you floss. Contact a health care provider if:  You are unsure if you are in labor or if your water has broken.  You become dizzy.  You have mild pelvic cramps, pelvic pressure, or nagging pain in your abdominal area.  You have lower back pain.  You have persistent  nausea, vomiting, or diarrhea.  You have an unusual or bad smelling vaginal discharge.  You have pain when you urinate. Get help right away if:  Your water breaks before 37 weeks.  You have regular contractions less than 5 minutes apart before 37 weeks.  You have a fever.  You are leaking fluid from your vagina.  You have spotting or bleeding from your vagina.  You have severe abdominal pain or cramping.  You have rapid weight loss or weight gain.    You have shortness of breath with chest pain.  You notice sudden or extreme swelling of your face, hands, ankles, feet, or legs.  Your baby makes fewer than 10 movements in 2 hours.  You have severe headaches that do not go away when you take medicine.  You have vision changes. Summary  The third trimester is from week 28 through week 40, months 7 through 9. The third trimester is a time when the unborn baby (fetus) is growing rapidly.  During the third trimester, your discomfort may increase as you and your baby continue to gain weight. You may have abdominal, leg, and back pain, sleeping problems, and an increased need to urinate.  During the third trimester your breasts will keep growing and they will continue to become tender. A yellow fluid (colostrum) may leak from your breasts. This is the first milk you are producing for your baby.  False labor is a condition in which you feel small, irregular tightenings of the muscles in the womb (contractions) that eventually go away. These are called Braxton Hicks contractions. Contractions may last for hours, days, or even weeks before true labor sets in.  Signs of labor can include: abdominal cramps; regular contractions that start at 10 minutes apart and become stronger and more frequent with time; watery or bloody mucus discharge that comes from the vagina; increased pelvic pressure and dull back pain; and leaking of amniotic fluid. This information is not intended to replace advice  given to you by your health care provider. Make sure you discuss any questions you have with your health care provider. Document Released: 12/08/2001 Document Revised: 05/21/2016 Document Reviewed: 02/14/2013 Elsevier Interactive Patient Education  2017 [employer]  

## 2017-09-28 IMAGING — US US RENAL
1 series · 14 of 21 positions shown · non-contrast
Comparison: None.

CLINICAL DATA: Left flank pain for 4 days

EXAM:
RENAL / URINARY TRACT ULTRASOUND COMPLETE

[Series 1: us renal · 0.24mm/px · 14 of 21 slices shown]
[im 1/21]
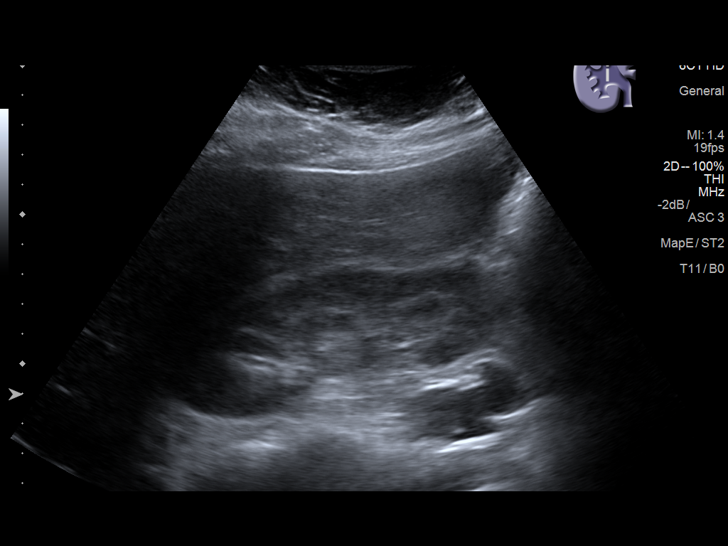
[im 3/21]
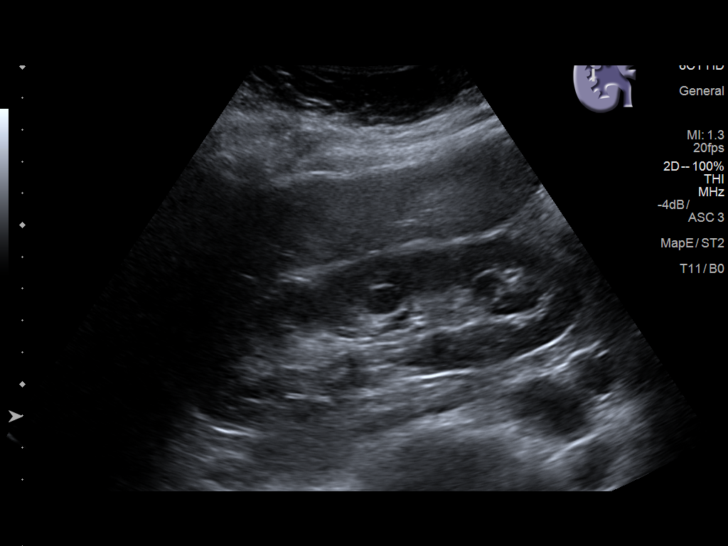
[im 4/21]
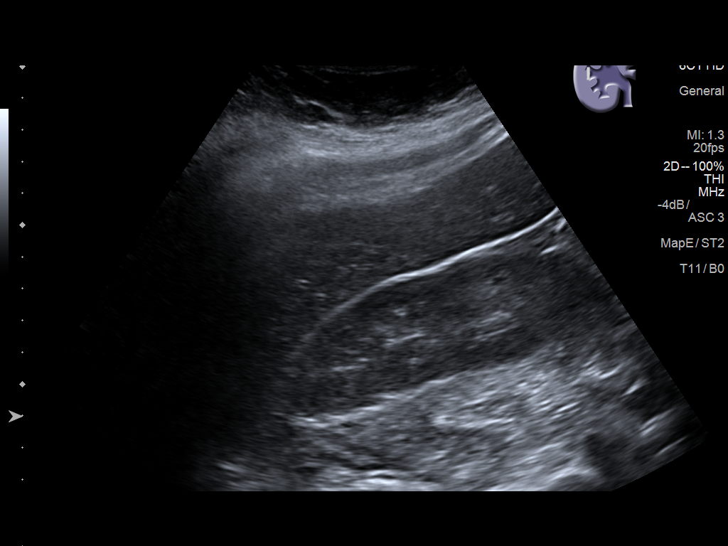
[im 6/21]
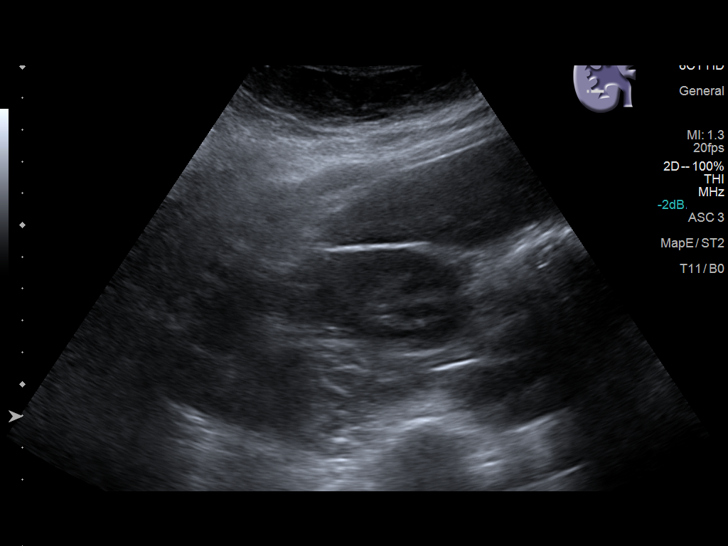
[im 7/21]
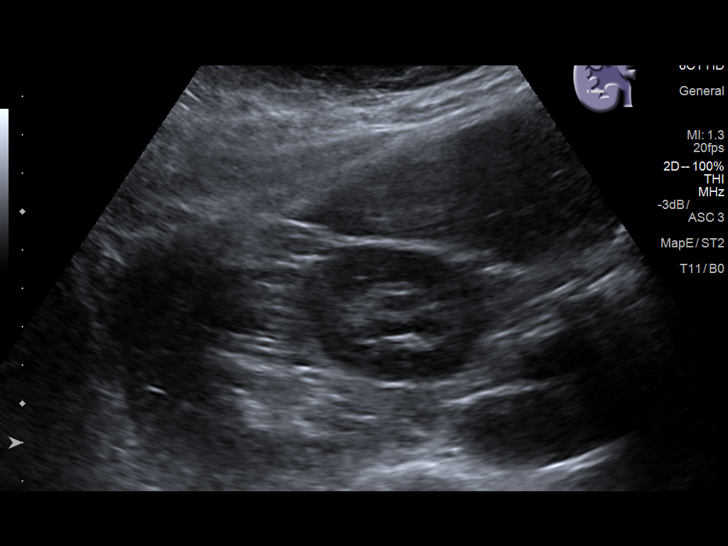
[im 9/21]
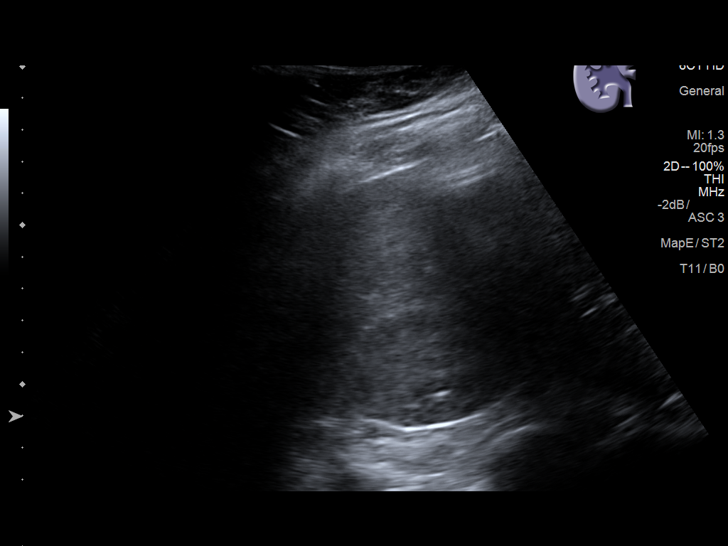
[im 10/21]
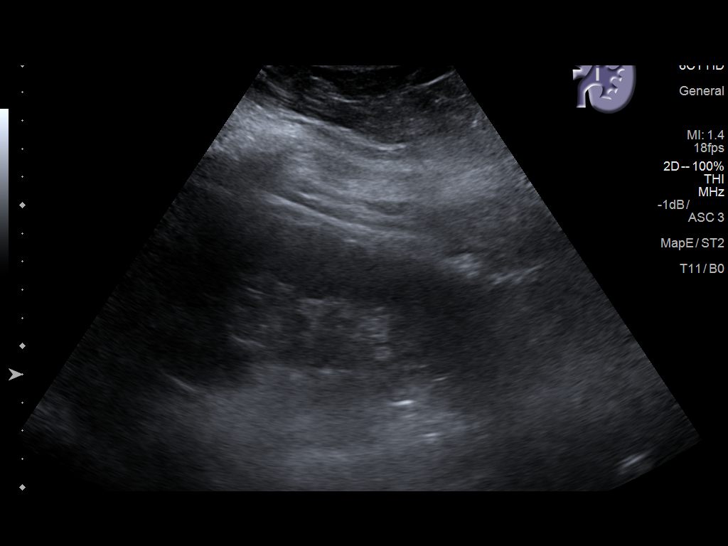
[im 12/21]
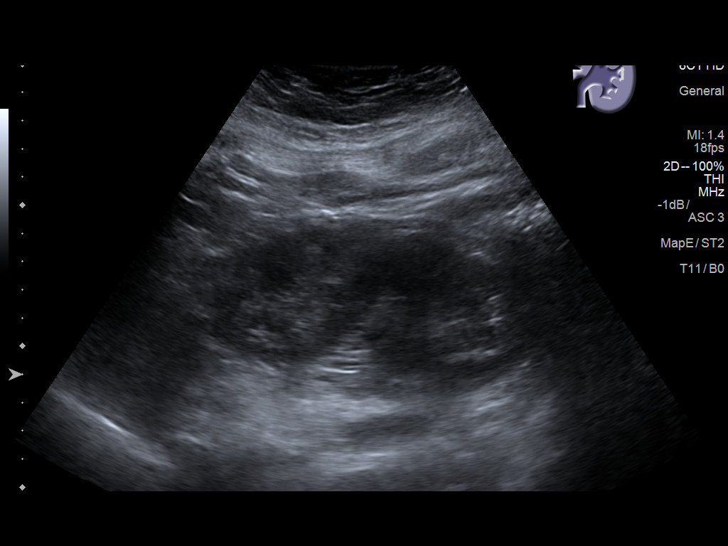
[im 13/21]
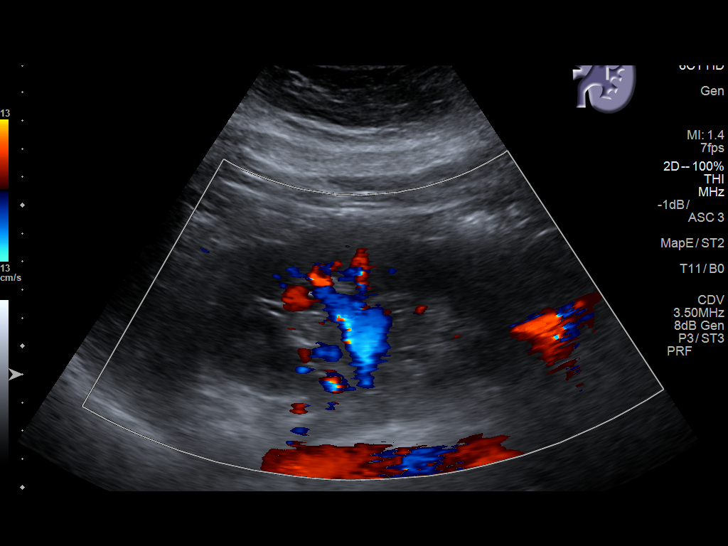
[im 15/21]
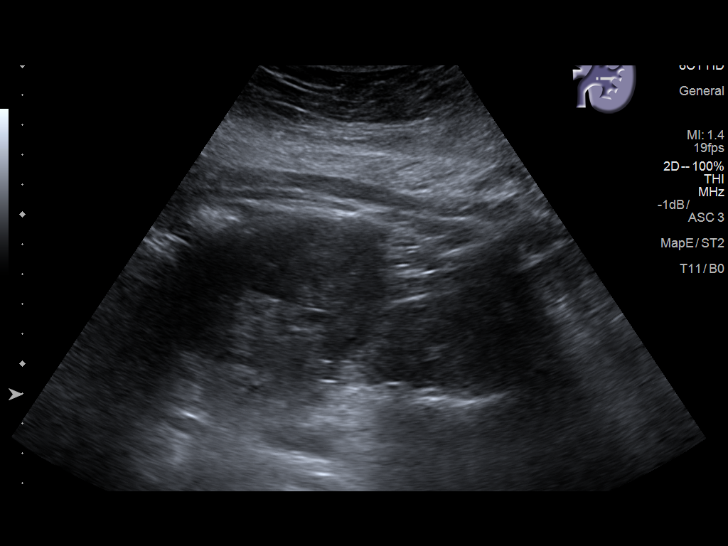
[im 16/21]
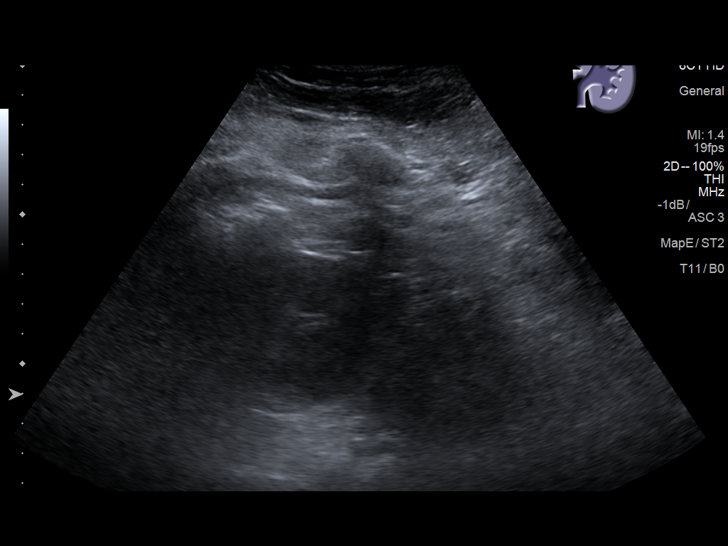
[im 18/21]
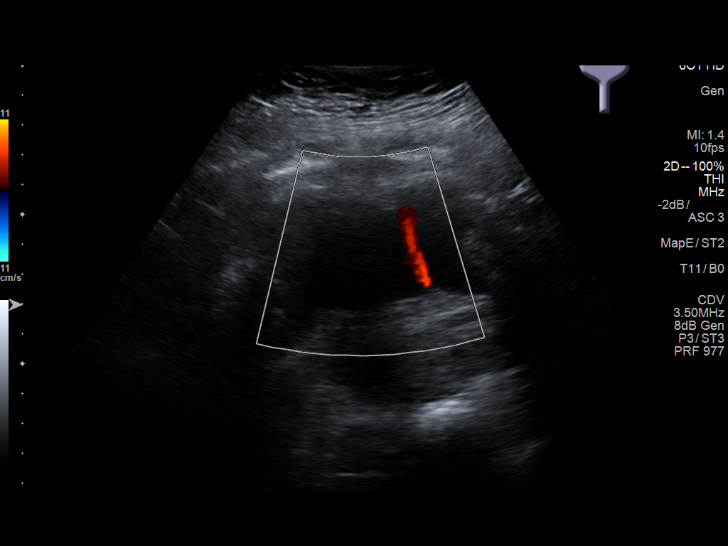
[im 19/21]
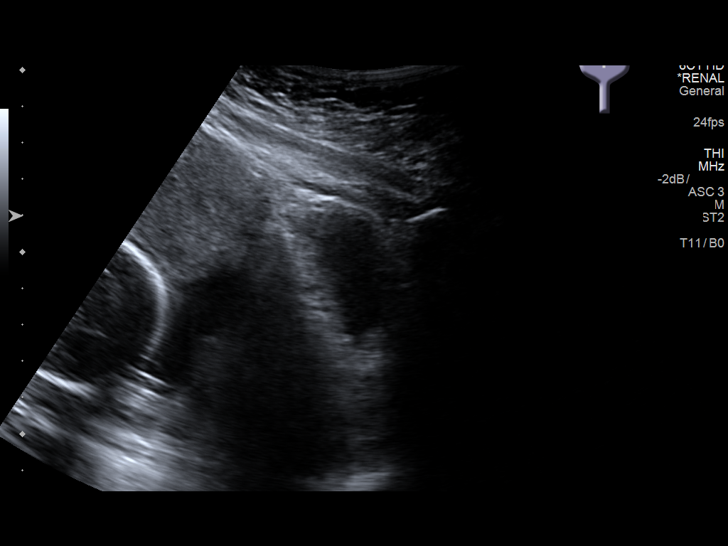
[im 21/21]
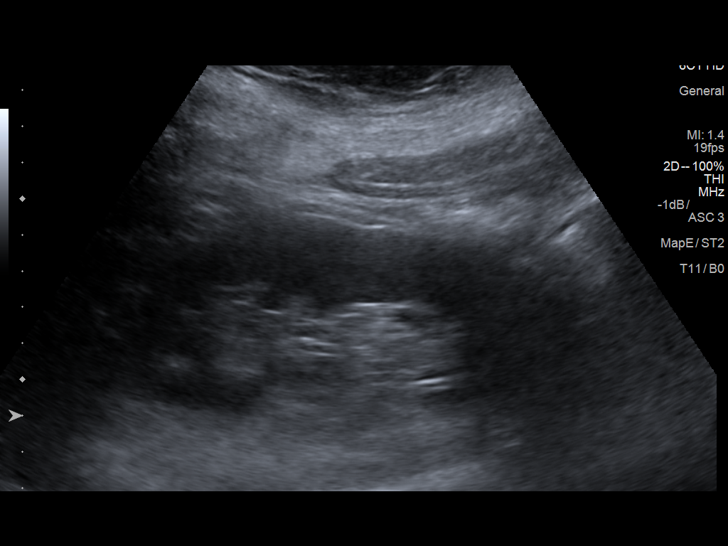

[14 of 21 positions shown; findings below may reference images not displayed]

FINDINGS: Right Kidney:

Length: 12.6 cm. Echogenicity within normal limits. No mass or
hydronephrosis visualized. Multiple hypoechoic areas near the renal
hilum may be parapelvic cysts or prominent pyramids.

Left Kidney:

Length: 12.6 cm. Echogenicity within normal limits. No mass or
hydronephrosis visualized.

Bladder:

Appears normal for degree of bladder distention. The left ureteral
jet was visualized.
IMPRESSION: No nephrolithiasis or hydronephrosis.

## 2017-10-05 ENCOUNTER — Encounter: Payer: Medicaid Other | Admitting: Advanced Practice Midwife

## 2017-10-06 ENCOUNTER — Encounter: Payer: Medicaid Other | Admitting: Family Medicine

## 2017-10-13 ENCOUNTER — Ambulatory Visit (INDEPENDENT_AMBULATORY_CARE_PROVIDER_SITE_OTHER): Payer: Medicaid Other | Admitting: Obstetrics & Gynecology

## 2017-10-13 ENCOUNTER — Other Ambulatory Visit (HOSPITAL_COMMUNITY)
Admission: RE | Admit: 2017-10-13 | Discharge: 2017-10-13 | Disposition: A | Discharge status: Home / Self Care | Payer: Medicaid Other | Source: Ambulatory Visit | Attending: Obstetrics & Gynecology | Admitting: Obstetrics & Gynecology

## 2017-10-13 ENCOUNTER — Other Ambulatory Visit: Payer: Self-pay | Admitting: Obstetrics & Gynecology

## 2017-10-13 VITALS — BP 138/88 | HR 81 | Wt 243.0 lb

## 2017-10-13 DIAGNOSIS — Z3480 Encounter for supervision of other normal pregnancy, unspecified trimester: Secondary | ICD-10-CM

## 2017-10-13 DIAGNOSIS — E669 Obesity, unspecified: Secondary | ICD-10-CM

## 2017-10-13 DIAGNOSIS — O99213 Obesity complicating pregnancy, third trimester: Secondary | ICD-10-CM

## 2017-10-13 DIAGNOSIS — O09523 Supervision of elderly multigravida, third trimester: Secondary | ICD-10-CM

## 2017-10-13 DIAGNOSIS — O99210 Obesity complicating pregnancy, unspecified trimester: Secondary | ICD-10-CM

## 2017-10-13 LAB — OB RESULTS CONSOLE GBS: STREP GROUP B AG: POSITIVE

## 2017-10-13 NOTE — Progress Notes (Addendum)
Pt states she has been having some HA, does have relief with Tylenol. Repeat BP manually 138/88, provider aware.

## 2017-10-13 NOTE — Progress Notes (Signed)
   PRENATAL VISIT NOTE  Subjective:  Nancy Jacobs is a 36 y.o. G3P2000 at 8554w0d being seen today for ongoing prenatal care.  She is currently monitored for the following issues for this high-risk pregnancy and has Supervision of other normal pregnancy, antepartum; Obesity in pregnancy; and AMA (advanced maternal age) multigravida 35+ on her problem list.  Patient reports no complaints.  Contractions: Irregular. Vag. Bleeding: None.  Movement: Present. Denies leaking of fluid.   The following portions of the patient's history were reviewed and updated as appropriate: allergies, current medications, past family history, past medical history, past social history, past surgical history and problem list. Problem list updated.  Objective:   Vitals:   10/13/17 1444  BP: (!) 146/91  Pulse: 81  Weight: 243 lb (110.2 kg)    Fetal Status: Fetal Heart Rate (bpm): 135   Movement: Present     General:  Alert, oriented and cooperative. Patient is in no acute distress.  Skin: Skin is warm and dry. No rash noted.   Cardiovascular: Normal heart rate noted  Respiratory: Normal respiratory effort, no problems with respiration noted  Abdomen: Soft, gravid, appropriate for gestational age.  Pain/Pressure: Present     Pelvic: Cervical exam performed        Extremities: Normal range of motion.     Mental Status:  Normal mood and affect. Normal behavior. Normal judgment and thought content.   Assessment and Plan:  Pregnancy: G3P2000 at 3654w0d  1. Supervision of other normal pregnancy, antepartum GBS done today  2. Obesity in pregnancy  3. Elderly multigravida in third trimester  Preterm labor symptoms and general obstetric precautions including but not limited to vaginal bleeding, contractions, leaking of fluid and fetal movement were reviewed in detail with the patient. Please refer to After Visit Summary for other counseling recommendations.  Return in about 1 week (around  10/20/2017).   Willodean Rosenthalarolyn Harraway-Smith, MD

## 2017-10-15 LAB — URINE CYTOLOGY ANCILLARY ONLY
Chlamydia: NEGATIVE
NEISSERIA GONORRHEA: NEGATIVE
Trichomonas: NEGATIVE

## 2017-10-16 LAB — STREP GP B NAA: Strep Gp B NAA: POSITIVE — AB

## 2017-10-17 ENCOUNTER — Encounter: Payer: Self-pay | Admitting: Obstetrics & Gynecology

## 2017-10-17 DIAGNOSIS — O99820 Streptococcus B carrier state complicating pregnancy: Secondary | ICD-10-CM | POA: Insufficient documentation

## 2017-10-20 ENCOUNTER — Ambulatory Visit (INDEPENDENT_AMBULATORY_CARE_PROVIDER_SITE_OTHER): Payer: Medicaid Other | Admitting: Obstetrics & Gynecology

## 2017-10-20 VITALS — BP 140/89 | HR 86 | Wt 244.0 lb

## 2017-10-20 DIAGNOSIS — Z3483 Encounter for supervision of other normal pregnancy, third trimester: Secondary | ICD-10-CM

## 2017-10-20 DIAGNOSIS — O99210 Obesity complicating pregnancy, unspecified trimester: Secondary | ICD-10-CM

## 2017-10-20 DIAGNOSIS — E669 Obesity, unspecified: Secondary | ICD-10-CM

## 2017-10-20 DIAGNOSIS — O99820 Streptococcus B carrier state complicating pregnancy: Secondary | ICD-10-CM

## 2017-10-20 DIAGNOSIS — O99213 Obesity complicating pregnancy, third trimester: Secondary | ICD-10-CM

## 2017-10-20 DIAGNOSIS — O09523 Supervision of elderly multigravida, third trimester: Secondary | ICD-10-CM

## 2017-10-20 DIAGNOSIS — Z3480 Encounter for supervision of other normal pregnancy, unspecified trimester: Secondary | ICD-10-CM

## 2017-10-20 NOTE — Progress Notes (Signed)
   PRENATAL VISIT NOTE  Subjective:  Nancy Jacobs is a 36 y.o. G3P2000 at 2222w0d being seen today for ongoing prenatal care.  She is currently monitored for the following issues for this high-risk pregnancy and has Supervision of other normal pregnancy, antepartum; Obesity in pregnancy; AMA (advanced maternal age) multigravida 35+; and Group B Streptococcus carrier, antepartum on her problem list.  Patient reports no complaints.  Contractions: Irregular. Vag. Bleeding: Scant.  Movement: Present. Denies leaking of fluid.   The following portions of the patient's history were reviewed and updated as appropriate: allergies, current medications, past family history, past medical history, past social history, past surgical history and problem list. Problem list updated.  Objective:   Vitals:   10/20/17 1454  BP: 140/89  Pulse: 86  Weight: 244 lb (110.7 kg)    Fetal Status:     Movement: Present     General:  Alert, oriented and cooperative. Patient is in no acute distress.  Skin: Skin is warm and dry. No rash noted.   Cardiovascular: Normal heart rate noted  Respiratory: Normal respiratory effort, no problems with respiration noted  Abdomen: Soft, gravid, appropriate for gestational age.  Pain/Pressure: Present     Pelvic: Cervical exam deferred        Extremities: Normal range of motion.  Edema: Trace  Mental Status:  Normal mood and affect. Normal behavior. Normal judgment and thought content.   Assessment and Plan:  Pregnancy: G3P2000 at 3622w0d  1. Supervision of other normal pregnancy, antepartum  2. Obesity in pregnancy  3. Group B Streptococcus carrier, antepartum Pt notified of need for IV atbx in labor  4. Elderly multigravida in third trimester  Term labor symptoms and general obstetric precautions including but not limited to vaginal bleeding, contractions, leaking of fluid and fetal movement were reviewed in detail with the patient. Please refer to After Visit Summary  for other counseling recommendations.  Return in about 1 week (around 10/27/2017).   Willodean Rosenthalarolyn Harraway-Smith, MD

## 2017-10-25 ENCOUNTER — Encounter (HOSPITAL_COMMUNITY): Payer: Self-pay | Admitting: *Deleted

## 2017-10-25 ENCOUNTER — Inpatient Hospital Stay (HOSPITAL_COMMUNITY)
Admission: AD | Admit: 2017-10-25 | Discharge: 2017-10-27 | DRG: 807 | Disposition: A | Discharge status: Home / Self Care | Payer: Medicaid Other | Source: Ambulatory Visit | Attending: Obstetrics & Gynecology | Admitting: Obstetrics & Gynecology

## 2017-10-25 DIAGNOSIS — Z3480 Encounter for supervision of other normal pregnancy, unspecified trimester: Secondary | ICD-10-CM

## 2017-10-25 DIAGNOSIS — Z87891 Personal history of nicotine dependence: Secondary | ICD-10-CM

## 2017-10-25 DIAGNOSIS — O163 Unspecified maternal hypertension, third trimester: Secondary | ICD-10-CM | POA: Diagnosis present

## 2017-10-25 DIAGNOSIS — O99820 Streptococcus B carrier state complicating pregnancy: Secondary | ICD-10-CM

## 2017-10-25 DIAGNOSIS — O134 Gestational [pregnancy-induced] hypertension without significant proteinuria, complicating childbirth: Secondary | ICD-10-CM | POA: Diagnosis present

## 2017-10-25 DIAGNOSIS — O99824 Streptococcus B carrier state complicating childbirth: Secondary | ICD-10-CM | POA: Diagnosis present

## 2017-10-25 DIAGNOSIS — O99214 Obesity complicating childbirth: Secondary | ICD-10-CM | POA: Diagnosis present

## 2017-10-25 DIAGNOSIS — Z3A37 37 weeks gestation of pregnancy: Secondary | ICD-10-CM | POA: Diagnosis not present

## 2017-10-25 DIAGNOSIS — O99210 Obesity complicating pregnancy, unspecified trimester: Secondary | ICD-10-CM

## 2017-10-25 LAB — CBC
HEMATOCRIT: 30.9 % — AB (ref 36.0–46.0)
Hemoglobin: 10.4 g/dL — ABNORMAL LOW (ref 12.0–15.0)
MCH: 31.7 pg (ref 26.0–34.0)
MCHC: 33.7 g/dL (ref 30.0–36.0)
MCV: 94.2 fL (ref 78.0–100.0)
Platelets: 312 10*3/uL (ref 150–400)
RBC: 3.28 MIL/uL — ABNORMAL LOW (ref 3.87–5.11)
RDW: 15.4 % (ref 11.5–15.5)
WBC: 11.4 10*3/uL — ABNORMAL HIGH (ref 4.0–10.5)

## 2017-10-25 LAB — COMPREHENSIVE METABOLIC PANEL
ALBUMIN: 2.7 g/dL — AB (ref 3.5–5.0)
ALT: 15 U/L (ref 14–54)
AST: 21 U/L (ref 15–41)
Alkaline Phosphatase: 160 U/L — ABNORMAL HIGH (ref 38–126)
Anion gap: 8 (ref 5–15)
BUN: 6 mg/dL (ref 6–20)
CHLORIDE: 108 mmol/L (ref 101–111)
CO2: 21 mmol/L — AB (ref 22–32)
CREATININE: 0.67 mg/dL (ref 0.44–1.00)
Calcium: 9.1 mg/dL (ref 8.9–10.3)
GFR calc Af Amer: >60 mL/min (ref >60)
GLUCOSE: 89 mg/dL (ref 65–99)
POTASSIUM: 4.1 mmol/L (ref 3.5–5.1)
Sodium: 137 mmol/L (ref 135–145)
Total Bilirubin: 0.2 mg/dL — ABNORMAL LOW (ref 0.3–1.2)
Total Protein: 6.1 g/dL — ABNORMAL LOW (ref 6.5–8.1)

## 2017-10-25 LAB — TYPE AND SCREEN
ABO/RH(D): O POS
ANTIBODY SCREEN: NEGATIVE

## 2017-10-25 LAB — PROTEIN / CREATININE RATIO, URINE
Creatinine, Urine: 129 mg/dL
PROTEIN CREATININE RATIO: 0.1 mg/mg{creat} (ref 0.00–0.15)
Total Protein, Urine: 13 mg/dL

## 2017-10-25 LAB — ABO/RH: ABO/RH(D): O POS

## 2017-10-25 MED ORDER — MISOPROSTOL 50MCG HALF TABLET
50.0000 ug | ORAL_TABLET | Freq: Once | ORAL | Status: AC
  Administered 2017-10-25: 50 ug via BUCCAL
  Filled 2017-10-25: qty 1

## 2017-10-25 MED ORDER — OXYTOCIN 40 UNITS IN LACTATED RINGERS INFUSION - SIMPLE MED: 2.5000 [IU]/h | INTRAVENOUS | Status: DC

## 2017-10-25 MED ORDER — SOD CITRATE-CITRIC ACID 500-334 MG/5ML PO SOLN: 30.0000 mL | ORAL | Status: DC | PRN

## 2017-10-25 MED ORDER — PENICILLIN G POT IN DEXTROSE 60000 UNIT/ML IV SOLN
3.0000 10*6.[IU] | INTRAVENOUS | Status: DC
  Administered 2017-10-25 – 2017-10-26 (×4): 3 10*6.[IU] via INTRAVENOUS
  Filled 2017-10-25 (×8): qty 50

## 2017-10-25 MED ORDER — OXYTOCIN BOLUS FROM INFUSION
500.0000 mL | Freq: Once | INTRAVENOUS | Status: AC
  Administered 2017-10-26: 500 mL via INTRAVENOUS

## 2017-10-25 MED ORDER — FENTANYL CITRATE (PF) 100 MCG/2ML IJ SOLN
100.0000 ug | INTRAMUSCULAR | Status: DC | PRN
  Administered 2017-10-26 (×2): 100 ug via INTRAVENOUS
  Filled 2017-10-25 (×2): qty 2

## 2017-10-25 MED ORDER — HYDRALAZINE HCL 20 MG/ML IJ SOLN: 10.0000 mg | Freq: Once | INTRAMUSCULAR | Status: DC | PRN

## 2017-10-25 MED ORDER — ONDANSETRON HCL 4 MG/2ML IJ SOLN: 4.0000 mg | Freq: Four times a day (QID) | INTRAMUSCULAR | Status: DC | PRN

## 2017-10-25 MED ORDER — ACETAMINOPHEN 325 MG PO TABS
650.0000 mg | ORAL_TABLET | ORAL | Status: DC | PRN
  Administered 2017-10-25: 650 mg via ORAL
  Filled 2017-10-25: qty 2

## 2017-10-25 MED ORDER — LIDOCAINE HCL (PF) 1 % IJ SOLN
30.0000 mL | INTRAMUSCULAR | Status: DC | PRN
  Filled 2017-10-25: qty 30

## 2017-10-25 MED ORDER — LACTATED RINGERS IV SOLN
INTRAVENOUS | Status: DC
  Administered 2017-10-25 – 2017-10-26 (×2): via INTRAVENOUS

## 2017-10-25 MED ORDER — LABETALOL HCL 5 MG/ML IV SOLN
20.0000 mg | INTRAVENOUS | Status: DC | PRN
  Administered 2017-10-26: 20 mg via INTRAVENOUS
  Filled 2017-10-25: qty 4

## 2017-10-25 MED ORDER — LACTATED RINGERS IV SOLN: 500.0000 mL | INTRAVENOUS | Status: DC | PRN

## 2017-10-25 MED ORDER — OXYCODONE-ACETAMINOPHEN 5-325 MG PO TABS: 2.0000 | ORAL_TABLET | ORAL | Status: DC | PRN

## 2017-10-25 MED ORDER — DEXTROSE 5 % IV SOLN
5.0000 10*6.[IU] | Freq: Once | INTRAVENOUS | Status: AC
  Administered 2017-10-25: 5 10*6.[IU] via INTRAVENOUS
  Filled 2017-10-25: qty 5

## 2017-10-25 MED ORDER — FLEET ENEMA 7-19 GM/118ML RE ENEM: 1.0000 | ENEMA | RECTAL | Status: DC | PRN

## 2017-10-25 MED ORDER — TERBUTALINE SULFATE 1 MG/ML IJ SOLN
0.2500 mg | Freq: Once | INTRAMUSCULAR | Status: DC | PRN
  Filled 2017-10-25: qty 1

## 2017-10-25 MED ORDER — OXYCODONE-ACETAMINOPHEN 5-325 MG PO TABS: 1.0000 | ORAL_TABLET | ORAL | Status: DC | PRN

## 2017-10-25 MED ORDER — OXYTOCIN 40 UNITS IN LACTATED RINGERS INFUSION - SIMPLE MED
1.0000 m[IU]/min | INTRAVENOUS | Status: DC
Start: 2017-10-25 — End: 2017-10-26
  Administered 2017-10-26: 2 m[IU]/min via INTRAVENOUS
  Filled 2017-10-25: qty 1000

## 2017-10-25 NOTE — H&P (Signed)
Obstetric History and Physical  Brayleigh Rybacki is a 36 y.o. [id number] with IUP at [redacted]w[redacted]d presenting for IOL for gHTN. Patient presented to [facility] with concerns for labor and was found to have elevated pressures. Patient states she has a history of preeclampsia with her prior two deliveries.  Patient states she has been having  regular contractions, none vaginal bleeding, intact membranes, with active fetal movement.    No blurry vision, peripheral edema, and RUQ pain. Has a new headache that started after IV.   Prenatal Course Source of Care: [facility] with onset of care at 19 weeks Dating: By LMP --->  Estimated Date of Delivery: 11/10/17 Pregnancy complications or risks: Patient Active Problem List   Diagnosis Date Noted  . Group B Streptococcus carrier, antepartum 10/17/2017  . Supervision of other normal pregnancy, antepartum 06/18/2017  . Obesity in pregnancy 06/18/2017  . AMA (advanced maternal age) multigravida 35+ 06/18/2017   She breast and bottle feed She desires possible bilateral tubal ligation for postpartum contraception. Signed papers.  Prenatal labs and studies: ABO, Rh: O/Positive/-- (05/14 0000) Antibody: Negative (05/14 0000) Rubella: Immune (05/14 0000) RPR: Non Reactive (08/23 0945)  HBsAg: Negative (05/14 0000)  HIV: Non-reactive (05/14 0000)  AVW:UJWJXBJY (10/17 0000) 2 hr Glucola  normal Genetic screening normal Anatomy US normal  Prenatal Transfer Tool  Maternal Diabetes: No Genetic Screening: Normal Maternal Ultrasounds/Referrals: Normal Fetal Ultrasounds or other Referrals:  None Maternal Substance Abuse:  No Significant Maternal Medications:  None Significant Maternal Lab Results: Lab values include: Group B Strep positive  Past Medical History:  Diagnosis Date  . Vaginal Pap smear, abnormal     Past Surgical History:  Procedure Laterality Date  . KNEE CARTILAGE SURGERY    . LEG SURGERY      OB History  Gravida Para Term Preterm AB Living  3 2 2      2   SAB TAB Ectopic Multiple Live Births          2    # Outcome Date GA Lbr Len/2nd Weight Sex Delivery Anes PTL Lv  3 Current           2 Term 02/01/11     Vag-Spont     1 Term 01/07/10 [redacted]w[redacted]d    Vag-Spont         Social History   Social History  . Marital status: Married    Spouse name: N/A  . Number of children: N/A  . Years of education: N/A   Social History Main Topics  . Smoking status: Former Smoker    Packs/day: 0.50  . Smokeless tobacco: Never Used  . Alcohol use Yes     Comment: occasional  . Drug use: Yes    Types: Marijuana  . Sexual activity: Yes   Other Topics Concern  . Not on file   Social History Narrative  . No narrative on file    Family History  Problem Relation Age of Onset  . Cancer Mother        liver  . Hypertension Mother   . Hypertension Father   . Hypertension Sister   . Cancer Maternal Aunt   . Cancer Maternal Grandmother   . Diabetes Maternal Grandfather     Prescriptions Prior to Admission  Medication Sig Dispense Refill Last Dose  . acetaminophen (TYLENOL) 325 MG tablet Take 650 mg by mouth every 6 (six) hours as needed for headache.   Past Month at Unknown time  . prenatal vitamin w/FE, FA (PRENATAL  1 + 1) 27-1 MG TABS tablet Take 1 tablet by mouth daily at 12 noon.   10/24/2017 at Unknown time  . Doxylamine-Pyridoxine 10-10 MG TBEC Take by mouth.   10/22/2017 at Unknown time  . ferrous sulfate (FERROUSUL) 325 (65 FE) MG tablet Take 1 tablet (325 mg total) by mouth 3 (three) times daily with meals. (Patient not taking: Reported on 10/20/2017) 90 tablet 2 Unknown at Unknown time    No Known Allergies  Review of Systems: Negative except for what is mentioned in HPI.  Physical Exam: BP (!) 149/97   Pulse 85   Temp 99 F (37.2 C) (Oral)   Resp 18   Ht 5\' 4"  (1.626 m)   Wt 110.7 kg (244 lb)   LMP 02/03/2017 (Exact Date)   BMI 41.88 kg/m  CONSTITUTIONAL: Well-developed, well-nourished female in no acute distress.  HENT:   Normocephalic, atraumatic, External right and left ear normal. Oropharynx is clear and moist EYES: Conjunctivae and EOM are normal. Pupils are equal, round, and reactive to light. No scleral icterus.  NECK: Normal range of motion, supple, no masses SKIN: Skin is warm and dry. No rash noted. Not diaphoretic. No erythema. No pallor. NEUROLOGIC: Alert and oriented to person, place, and time. Normal reflexes, muscle tone coordination. No cranial nerve deficit noted. PSYCHIATRIC: Normal mood and affect. Normal behavior. Normal judgment and thought content. CARDIOVASCULAR: Normal heart rate noted, regular rhythm RESPIRATORY: Effort and breath sounds normal, no problems with respiration noted ABDOMEN: Soft, nontender, nondistended, gravid. MUSCULOSKELETAL: Normal range of motion. No edema and no tenderness. 2+ distal pulses.  Cervical Exam: Dilatation 1 cm   Effacement 50%   Station -3 Presentation: cephalic FHT:  Baseline rate 130 bpm   Variability moderate  Accelerations present   Decelerations none Contractions: Occasional   Pertinent Labs/Studies:   Results for orders placed or performed during the hospital encounter of 10/25/17 (from the past 24 hour(s))  Protein / creatinine ratio, urine     Status: None   Collection Time: 10/25/17  4:42 PM  Result Value Ref Range   Creatinine, Urine 129.00 mg/dL   Total Protein, Urine 13 mg/dL   Protein Creatinine Ratio 0.10 0.00 - 0.15 mg/mg[Cre]  CBC     Status: Abnormal   Collection Time: 10/25/17  5:26 PM  Result Value Ref Range   WBC 11.4 (H) 4.0 - 10.5 K/uL   RBC 3.28 (L) 3.87 - 5.11 MIL/uL   Hemoglobin 10.4 (L) 12.0 - 15.0 g/dL   HCT 81.1 (L) 91.4 - 78.2 %   MCV 94.2 78.0 - 100.0 fL   MCH 31.7 26.0 - 34.0 pg   MCHC 33.7 30.0 - 36.0 g/dL   RDW 95.6 21.3 - 08.6 %   Platelets 312 150 - 400 K/uL  Type and screen [facility]     Status: None   Collection Time: 10/25/17  5:26 PM  Result Value Ref Range   ABO/RH(D) O POS     Antibody Screen NEG    Sample Expiration 10/28/2017   Comprehensive metabolic panel     Status: Abnormal   Collection Time: 10/25/17  5:26 PM  Result Value Ref Range   Sodium 137 135 - 145 mmol/L   Potassium 4.1 3.5 - 5.1 mmol/L   Chloride 108 101 - 111 mmol/L   CO2 21 (L) 22 - 32 mmol/L   Glucose, Bld 89 65 - 99 mg/dL   BUN 6 6 - 20 mg/dL   Creatinine, Ser 5.78  0.44 - 1.00 mg/dL   Calcium 9.1 8.9 - 16.110.3 mg/dL   Total Protein 6.1 (L) 6.5 - 8.1 g/dL   Albumin 2.7 (L) 3.5 - 5.0 g/dL   AST 21 15 - 41 U/L   ALT 15 14 - 54 U/L   Alkaline Phosphatase 160 (H) 38 - 126 U/L   Total Bilirubin 0.2 (L) 0.3 - 1.2 mg/dL   GFR calc non Af Amer >60 >60 mL/min   GFR calc Af Amer >60 >60 mL/min   Anion gap 8 5 - 15  ABO/Rh     Status: None   Collection Time: 10/25/17  5:26 PM  Result Value Ref Range   ABO/RH(D) O POS     Assessment : Charolette Forwardmanda Follett is a 36 y.o. G3P2002 at 3231w5d being admitted for induction of labor due to gHTN.  Plan: Labor: Induction/Augmentation as ordered as per protocol. Will start with cytotec and FB.  Analgesia as needed. GHTN: will get PIH labs to r/o preE. Trend BPs. Labetalol prn. FWB: Reassuring fetal heart tracing.  GBS positive, start PCN. Delivery plan: Hopeful for vaginal delivery   Caryl AdaJazma Phelps, DO OB Fellow Faculty Practice, [facility] 10/25/2017, 4:42 PM

## 2017-10-25 NOTE — Anesthesia Pain Management Evaluation Note (Signed)
  CRNA Pain Management Visit Note  Patient: Nancy Jacobs, 36 y.o., female  "Hello I am a member of the anesthesia team at [facility]. We have an anesthesia team available at all times to provide care throughout the hospital, including epidural management and anesthesia for C-section. I don't know your plan for the delivery whether it a natural birth, water birth, IV sedation, nitrous supplementation, doula or epidural, but we want to meet your pain goals."   1.Was your pain managed to your expectations on prior hospitalizations?   Yes   2.What is your expectation for pain management during this hospitalization?     Epidural  3.How can we help you reach that goal?   Record the patient's initial score and the patient's pain goal.   Pain: 5  Pain Goal: 6 [facility] wants you to be able to say your pain was always managed very well.  Laban EmperorMalinova,Analleli Gierke Hristova 10/25/2017

## 2017-10-25 NOTE — MAU Note (Signed)
Patient started contracting this morning around 11 a.m.  She was at work walking and lifting boxes.  She reports UC coming back to back.  UC is about 2 to 3 UC an hour, mainly when she is up moving around.

## 2017-10-25 NOTE — Progress Notes (Signed)
Labor Progress Note Nancy Jacobs is a 36 y.o. G3P2002 at [redacted]w[redacted]d presented for IOL for gHTN S: Pt admits to feeling uncomfortable  O:  BP (!) 152/84   Pulse 77   Temp 98.3 F (36.8 C) (Oral)   Resp 18   Ht 5\' 4"  (1.626 m)   Wt 244 lb (110.7 kg)   LMP 02/03/2017 (Exact Date)   BMI 41.88 kg/m  EFM: 150/pos acels/mod var Toco: infrequent contractions  CVE: Dilation: 1.5 Effacement (%): 50 Cervical Position: Posterior Station: -3 Presentation: Vertex Exam by:: Dr Doroteo GlassmanPhelps   A&P: 36 y.o. [id number] 803w5d here for IOL for gHTN #Labor: Foley bulb still in place. Start pitocin until adequate contraction pattern #FWB: cat 1 #gHTN- elevated blood pressure but not severe range. Pt remains asymptomatic.  Rolm BookbinderAmber Virgel Haro, DO 11:26 PM

## 2017-10-26 ENCOUNTER — Inpatient Hospital Stay (HOSPITAL_COMMUNITY): Payer: Medicaid Other | Admitting: Anesthesiology

## 2017-10-26 ENCOUNTER — Encounter (HOSPITAL_COMMUNITY): Payer: Self-pay | Admitting: Anesthesiology

## 2017-10-26 DIAGNOSIS — O134 Gestational [pregnancy-induced] hypertension without significant proteinuria, complicating childbirth: Secondary | ICD-10-CM

## 2017-10-26 DIAGNOSIS — O99824 Streptococcus B carrier state complicating childbirth: Secondary | ICD-10-CM

## 2017-10-26 DIAGNOSIS — Z3A37 37 weeks gestation of pregnancy: Secondary | ICD-10-CM

## 2017-10-26 LAB — CBC
HEMATOCRIT: 29.2 % — AB (ref 36.0–46.0)
HEMOGLOBIN: 9.9 g/dL — AB (ref 12.0–15.0)
MCH: 32 pg (ref 26.0–34.0)
MCHC: 33.9 g/dL (ref 30.0–36.0)
MCV: 94.5 fL (ref 78.0–100.0)
Platelets: 281 10*3/uL (ref 150–400)
RBC: 3.09 MIL/uL — ABNORMAL LOW (ref 3.87–5.11)
RDW: 15.6 % — ABNORMAL HIGH (ref 11.5–15.5)
WBC: 12 10*3/uL — ABNORMAL HIGH (ref 4.0–10.5)

## 2017-10-26 LAB — RPR: RPR: NONREACTIVE

## 2017-10-26 MED ORDER — PIPERACILLIN-TAZOBACTAM 3.375 G IVPB 30 MIN
3.3750 g | Freq: Once | INTRAVENOUS | Status: AC
  Administered 2017-10-26: 3.375 g via INTRAVENOUS
  Filled 2017-10-26: qty 50

## 2017-10-26 MED ORDER — EPHEDRINE 5 MG/ML INJ
10.0000 mg | INTRAVENOUS | Status: DC | PRN
  Filled 2017-10-26: qty 2

## 2017-10-26 MED ORDER — PIPERACILLIN-TAZOBACTAM 3.375 G IVPB
3.3750 g | Freq: Three times a day (TID) | INTRAVENOUS | Status: DC
  Administered 2017-10-27: 3.375 g via INTRAVENOUS
  Filled 2017-10-26 (×3): qty 50

## 2017-10-26 MED ORDER — FENTANYL 2.5 MCG/ML BUPIVACAINE 1/10 % EPIDURAL INFUSION (WH - ANES)
14.0000 mL/h | INTRAMUSCULAR | Status: DC | PRN
  Administered 2017-10-26: 14 mL/h via EPIDURAL
  Filled 2017-10-26: qty 100

## 2017-10-26 MED ORDER — IBUPROFEN 600 MG PO TABS
600.0000 mg | ORAL_TABLET | Freq: Four times a day (QID) | ORAL | Status: DC
  Administered 2017-10-26 – 2017-10-27 (×3): 600 mg via ORAL
  Filled 2017-10-26 (×3): qty 1

## 2017-10-26 MED ORDER — SIMETHICONE 80 MG PO CHEW: 80.0000 mg | CHEWABLE_TABLET | ORAL | Status: DC | PRN

## 2017-10-26 MED ORDER — TETANUS-DIPHTH-ACELL PERTUSSIS 5-2.5-18.5 LF-MCG/0.5 IM SUSP: 0.5000 mL | Freq: Once | INTRAMUSCULAR | Status: DC

## 2017-10-26 MED ORDER — LACTATED RINGERS IV SOLN: 500.0000 mL | Freq: Once | INTRAVENOUS | Status: DC

## 2017-10-26 MED ORDER — DIPHENHYDRAMINE HCL 25 MG PO CAPS: 25.0000 mg | ORAL_CAPSULE | Freq: Four times a day (QID) | ORAL | Status: DC | PRN

## 2017-10-26 MED ORDER — DIBUCAINE 1 % RE OINT: 1.0000 "application " | TOPICAL_OINTMENT | RECTAL | Status: DC | PRN

## 2017-10-26 MED ORDER — DIPHENHYDRAMINE HCL 50 MG/ML IJ SOLN: 12.5000 mg | INTRAMUSCULAR | Status: DC | PRN

## 2017-10-26 MED ORDER — WITCH HAZEL-GLYCERIN EX PADS: 1.0000 "application " | MEDICATED_PAD | CUTANEOUS | Status: DC | PRN

## 2017-10-26 MED ORDER — ACETAMINOPHEN 325 MG PO TABS: 650.0000 mg | ORAL_TABLET | ORAL | Status: DC | PRN

## 2017-10-26 MED ORDER — PRENATAL MULTIVITAMIN CH
1.0000 | ORAL_TABLET | Freq: Every day | ORAL | Status: DC
  Administered 2017-10-27: 1 via ORAL
  Filled 2017-10-26: qty 1

## 2017-10-26 MED ORDER — ONDANSETRON HCL 4 MG/2ML IJ SOLN: 4.0000 mg | INTRAMUSCULAR | Status: DC | PRN

## 2017-10-26 MED ORDER — LIDOCAINE HCL (PF) 1 % IJ SOLN
INTRAMUSCULAR | Status: DC | PRN
  Administered 2017-10-26: 6 mL via EPIDURAL
  Administered 2017-10-26: 77 mL via EPIDURAL

## 2017-10-26 MED ORDER — ONDANSETRON HCL 4 MG PO TABS: 4.0000 mg | ORAL_TABLET | ORAL | Status: DC | PRN

## 2017-10-26 MED ORDER — PHENYLEPHRINE 40 MCG/ML (10ML) SYRINGE FOR IV PUSH (FOR BLOOD PRESSURE SUPPORT)
80.0000 ug | PREFILLED_SYRINGE | INTRAVENOUS | Status: DC | PRN
  Filled 2017-10-26: qty 5

## 2017-10-26 MED ORDER — PHENYLEPHRINE 40 MCG/ML (10ML) SYRINGE FOR IV PUSH (FOR BLOOD PRESSURE SUPPORT)
80.0000 ug | PREFILLED_SYRINGE | INTRAVENOUS | Status: DC | PRN
  Filled 2017-10-26: qty 5
  Filled 2017-10-26: qty 10

## 2017-10-26 MED ORDER — ZOLPIDEM TARTRATE 5 MG PO TABS: 5.0000 mg | ORAL_TABLET | Freq: Every evening | ORAL | Status: DC | PRN

## 2017-10-26 MED ORDER — BENZOCAINE-MENTHOL 20-0.5 % EX AERO: 1.0000 "application " | INHALATION_SPRAY | CUTANEOUS | Status: DC | PRN

## 2017-10-26 MED ORDER — COCONUT OIL OIL: 1.0000 "application " | TOPICAL_OIL | Status: DC | PRN

## 2017-10-26 MED ORDER — SENNOSIDES-DOCUSATE SODIUM 8.6-50 MG PO TABS
2.0000 | ORAL_TABLET | ORAL | Status: DC
  Administered 2017-10-27: 2 via ORAL
  Filled 2017-10-26: qty 2

## 2017-10-26 NOTE — Anesthesia Procedure Notes (Signed)
Epidural Patient location during procedure: OB Start time: 10/26/2017 9:38 AM End time: 10/26/2017 9:46 AM  Staffing Anesthesiologist: Leilani AbleHATCHETT, Brieana Shimmin  Preanesthetic Checklist Completed: patient identified, surgical consent, pre-op evaluation, timeout performed, IV checked, risks and benefits discussed and monitors and equipment checked  Epidural Patient position: sitting Prep: site prepped and draped and DuraPrep Patient monitoring: continuous pulse ox and blood pressure Approach: midline Location: L3-L4 Injection technique: LOR air  Needle:  Needle type: Tuohy  Needle gauge: 17 G Needle length: 9 cm and 9 Needle insertion depth: 8 cm Catheter type: closed end flexible Catheter size: 19 Gauge Catheter at skin depth: 14 cm Test dose: negative and Other  Assessment Sensory level: T9 Events: blood not aspirated, injection not painful, no injection resistance, negative IV test and no paresthesia  Additional Notes Reason for block:procedure for pain

## 2017-10-26 NOTE — Progress Notes (Signed)
Pt refusing to change positions during fetal decel. Pt made aware importance of position change and pt still refusing. Pt states she wants to stand up. Pt made aware again she cannot stand up due to having her epidural in and currently running. FOB at bedside during conversation. Charge nurse made aware. CNM made aware. Will continue to monitor

## 2017-10-26 NOTE — Progress Notes (Signed)
Labor Progress Note Nancy Jacobs is a 36 y.o. G3P2002 at 6357w6d presented for IOL for gHTN. S: No complaints  O:  BP 132/68   Pulse 70   Temp 97.8 F (36.6 C) (Oral)   Resp 17   Ht 5\' 4"  (1.626 m)   Wt 244 lb (110.7 kg)   LMP 02/03/2017 (Exact Date)   BMI 41.88 kg/m  EFM: 130/mod var/pos acels/no decels  CVE: Dilation: 6 Effacement (%): 50 Cervical Position: Posterior Station: -2 Presentation: Vertex Exam by:: Dr Rachelle HoraMoss   A&P: 36 y.o. [id number] 357w6d here for IOL for gHTN #Labor: Progressing well. AROM at 0600 with clear fluid. IUPC placed at that time. Continue pitocin as needed for adequate contraction pattern. #gHTN- severe range pressure around 0200, given labetolol and improved since that time  Chubb Corporationmber Kellon Chalk, DO 7:20 AM

## 2017-10-26 NOTE — Anesthesia Preprocedure Evaluation (Signed)
Anesthesia Evaluation  Patient identified by MRN, date of birth, ID band Patient awake    Reviewed: Allergy & Precautions, H&P , NPO status , Patient's Chart, lab work & pertinent test results  Airway Mallampati: III  TM Distance: >3 FB Neck ROM: full    Dental no notable dental hx.    Pulmonary former smoker,    Pulmonary exam normal breath sounds clear to auscultation       Cardiovascular negative cardio ROS Normal cardiovascular exam Rhythm:regular Rate:Normal     Neuro/Psych negative neurological ROS  negative psych ROS   GI/Hepatic negative GI ROS, Neg liver ROS,   Endo/Other  Morbid obesity  Renal/GU negative Renal ROS     Musculoskeletal   Abdominal (+) + obese,   Peds  Hematology negative hematology ROS (+)   Anesthesia Other Findings   Reproductive/Obstetrics (+) Pregnancy                             Anesthesia Physical Anesthesia Plan  ASA: III  Anesthesia Plan: Epidural   Post-op Pain Management:    Induction:   PONV Risk Score and Plan:   Airway Management Planned:   Additional Equipment:   Intra-op Plan:   Post-operative Plan:   Informed Consent: I have reviewed the patients History and Physical, chart, labs and discussed the procedure including the risks, benefits and alternatives for the proposed anesthesia with the patient or authorized representative who has indicated his/her understanding and acceptance.     Plan Discussed with:   Anesthesia Plan Comments:         Anesthesia Quick Evaluation

## 2017-10-26 NOTE — Progress Notes (Signed)
Reassumed care at this time

## 2017-10-26 NOTE — Progress Notes (Signed)
Nancy Jacobs is a 36 y.o. G3P2002 at 3873w6d by LMP admitted for induction of labor due to Hypertension.  Subjective: Patient received epidural @0950  right before exam. No complaints other than pain.  Objective: BP 104/81   Pulse 70   Temp 97.7 F (36.5 C) (Oral)   Resp 20   Ht 5\' 4"  (1.626 m)   Wt 110.7 kg (244 lb)   LMP 02/03/2017 (Exact Date)   SpO2 99%   BMI 41.88 kg/m  No intake/output data recorded. No intake/output data recorded.  FHT:  FHR: 130 bpm, variability: moderate,  accelerations:  Abscent,  decelerations:  Absent UC:   regular, every 2-4 minutes SVE:   Dilation: 6 Effacement (%): 50 Station: -2 Exam by:: Dr Rachelle HoraMoss  Labs: Lab Results  Component Value Date   WBC 12.0 (H) 10/26/2017   HGB 9.9 (L) 10/26/2017   HCT 29.2 (L) 10/26/2017   MCV 94.5 10/26/2017   PLT 281 10/26/2017    Assessment / Plan: Induction of labor due to gestational hypertension,  progressing well on pitocin  Labor: Progressing normally and AROM @ 0600 on 10/26/17; clear fluid. IUPC placed. Preeclampsia:  no signs or symptoms of toxicity Fetal Wellbeing:  Category I Pain Control:  Epidural I/D:  GBS Positive Anticipated MOD:  NSVD  Festus HoltsBrian W Bernish 10/26/2017, 10:26 AM  Midwife attestation I have seen and examined this patient and agree with above documentation in the student's note.   Nancy Jacobs, CNM 11:17 AM

## 2017-10-26 NOTE — Progress Notes (Signed)
Labor Progress Note Nancy Jacobs is a 36 y.o. G3P2002 at 8244w6d presented for IOL for gHTN S: Patient is comfortable with epidural but is notably frustrated.  O:  BP (!) 159/91   Pulse 95   Temp 98.8 F (37.1 C) (Oral)   Resp 18   Ht 5\' 4"  (1.626 m)   Wt 244 lb (110.7 kg)   LMP 02/03/2017 (Exact Date)   SpO2 99%   BMI 41.88 kg/m  EFM: 130/mod vari/ early decels  CVE: Dilation: 8 Effacement (%): 100 Cervical Position: Posterior Station: -1 Presentation: Vertex Exam by:: Melanie, CNM    A&P: 36 y.o. [id number] 3744w6d  IOL for gHTN #Labor: Progressing well.  #Pain: epidural #FWB: cat 2 #GBS positive #gHTN: Monitoring BP last value 159/91, given 1 dose labetalol @0231 . Pt on labetalol protocol  Suella BroadKeriann S Genever Hentges, MD 1:41 PM

## 2017-10-26 NOTE — Anesthesia Postprocedure Evaluation (Signed)
Anesthesia Post Note  Patient: Charolette Forwardmanda Sapia  Procedure(s) Performed: AN AD HOC LABOR EPIDURAL     Patient location during evaluation: Mother Baby Anesthesia Type: Epidural Level of consciousness: awake and alert and oriented Pain management: satisfactory to patient Vital Signs Assessment: post-procedure vital signs reviewed and stable Respiratory status: respiratory function stable Cardiovascular status: stable Postop Assessment: no headache, no backache, epidural receding, patient able to bend at knees, no signs of nausea or vomiting and adequate PO intake Anesthetic complications: no    Last Vitals:  Vitals:   10/26/17 1745 10/26/17 1845  BP: (!) 145/77 126/67  Pulse: 70 92  Resp: 18 18  Temp:  37.1 C  SpO2:      Last Pain:  Vitals:   10/26/17 1845  TempSrc: Axillary  PainSc: 0-No pain   Pain Goal: Patients Stated Pain Goal: 6 (10/25/17 1740)               Theola Cuellar

## 2017-10-26 NOTE — Progress Notes (Addendum)
Labor Progress Note Nancy Jacobs is a 36 y.o. G3P2002 at 3310w6d presented for IOL for GHTN  S:  Comfortable with epidural, feeling rectal pressure.  O:  BP 104/81   Pulse 70   Temp 97.7 F (36.5 C) (Oral)   Resp 20   Ht 5\' 4"  (1.626 m)   Wt 244 lb (110.7 kg)   LMP 02/03/2017 (Exact Date)   SpO2 99%   BMI 41.88 kg/m  EFM: baseline 125 bpm/ min variability/ no accels/ early decels  Toco: 2-4 SVE: 4.5/70/-1 Pitocin: 16 mu/min  A/P: 36 y.o. G3P2002 8110w6d  1. Labor: latent 2. FWB: Cat II 3. Pain: epidural  BP stable. FHT difficult to assess with external US > FSE placed, will watch closely. Anticipate labor progression and SVD.  Donette LarryMelanie Lucelia Lacey, CNM 11:10 AM

## 2017-10-26 NOTE — Progress Notes (Signed)
Care taken over by Rolene Arbouresmaine Lewis, RN.
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
## 2017-10-27 ENCOUNTER — Encounter: Payer: Medicaid Other | Admitting: Obstetrics & Gynecology

## 2017-10-27 LAB — CBC
HEMATOCRIT: 23.7 % — AB (ref 36.0–46.0)
HEMOGLOBIN: 8.2 g/dL — AB (ref 12.0–15.0)
MCH: 32.5 pg (ref 26.0–34.0)
MCHC: 34.6 g/dL (ref 30.0–36.0)
MCV: 94 fL (ref 78.0–100.0)
Platelets: 244 10*3/uL (ref 150–400)
RBC: 2.52 MIL/uL — AB (ref 3.87–5.11)
RDW: 15.6 % — ABNORMAL HIGH (ref 11.5–15.5)
WBC: 19.4 10*3/uL — ABNORMAL HIGH (ref 4.0–10.5)

## 2017-10-27 MED ORDER — SENNOSIDES-DOCUSATE SODIUM 8.6-50 MG PO TABS: 2.0000 | ORAL_TABLET | ORAL | 0 refills | Status: DC

## 2017-10-27 MED ORDER — AMLODIPINE BESYLATE 10 MG PO TABS: 10.0000 mg | ORAL_TABLET | Freq: Every day | ORAL | 0 refills | Status: DC

## 2017-10-27 MED ORDER — AMLODIPINE BESYLATE 10 MG PO TABS
10.0000 mg | ORAL_TABLET | Freq: Every day | ORAL | Status: DC
  Administered 2017-10-27: 10 mg via ORAL
  Filled 2017-10-27 (×2): qty 1

## 2017-10-27 MED ORDER — IBUPROFEN 600 MG PO TABS: 600.0000 mg | ORAL_TABLET | Freq: Four times a day (QID) | ORAL | 0 refills | Status: DC

## 2017-10-27 MED ORDER — FERROUS FUMARATE 324 (106 FE) MG PO TABS
1.0000 | ORAL_TABLET | Freq: Two times a day (BID) | ORAL | Status: DC
  Administered 2017-10-27: 106 mg via ORAL
  Filled 2017-10-27 (×3): qty 1

## 2017-10-27 NOTE — Discharge Instructions (Signed)

## 2017-10-27 NOTE — Progress Notes (Signed)
This RN notified Dr. Alyssa GroveProsper in person about elevated blood pressures, hgb 8.2, and the clot the pt states she expressed. Dr. Alyssa GroveProsper stated he would discuss findings with attending.

## 2017-10-27 NOTE — Discharge Summary (Signed)
OB Discharge Summary     Patient Name: Nancy Jacobs DOB: Apr 24, 1981 MRN: [id number]  Date of admission: 10/25/2017 Delivering MD: Suella BroadMINOTT, Nameer Summer S   Date of discharge: 10/27/2017  Admitting diagnosis: 37.5wks CTX 2 to 3 every hour and pains back to back when was at work Intrauterine pregnancy: 7177w6d     Secondary diagnosis:  Active Problems:   Elevated blood pressure affecting pregnancy in third trimester, antepartum  Additional problems: Obesity, AMA, Group B strep positive     Discharge diagnosis: Term Pregnancy Delivered                                                                                                Post partum procedures:n/a  Augmentation: Pitocin  Complications: retained placenta, manual extraction  Hospital course:  Induction of Labor With Vaginal Delivery   36 y.o. yo G3P3003 at 477w6d was admitted to the hospital 10/25/2017 for induction of labor.  Indication for induction: Gestational hypertension.  Patient had an uncomplicated labor course as follows: Membrane Rupture Time/Date: 6:38 AM ,10/26/2017   Intrapartum Procedures: Episiotomy: None [1]                                         Lacerations:  None [1]  Patient had delivery of a Viable infant.  Information for the patient's newborn:  Nancy Jacobs, Girl Lynetta [[id number]][[id number]]  Delivery Method: Vaginal, Spontaneous Delivery (Filed from Delivery Summary)   10/26/2017  Details of delivery can be found in separate delivery note.  Patient had a routine postpartum course. Patient is discharged home 10/27/17.  Physical exam  Vitals:   10/26/17 2215 10/27/17 0542 10/27/17 1136 10/27/17 1645  BP: 128/71 (!) 150/95 (!) 153/97 127/80  Pulse: 84 80  68  Resp: 18 18  20   Temp: 98.1 F (36.7 C) 98.5 F (36.9 C)  98.4 F (36.9 C)  TempSrc: Axillary Axillary  Oral  SpO2:      Weight:      Height:       General: alert, cooperative and no distress Lochia: appropriate Uterine Fundus: firm Incision:  N/A DVT Evaluation: No evidence of DVT seen on physical exam. No cords or calf tenderness. Labs: Lab Results  Component Value Date   WBC 19.4 (H) 10/27/2017   HGB 8.2 (L) 10/27/2017   HCT 23.7 (L) 10/27/2017   MCV 94.0 10/27/2017   PLT 244 10/27/2017   CMP Latest Ref Rng & Units 10/25/2017  Glucose 65 - 99 mg/dL 89  BUN 6 - 20 mg/dL 6  Creatinine 2.840.44 - 1.321.00 mg/dL 4.400.67  Sodium 102135 - 725145 mmol/L 137  Potassium 3.5 - 5.1 mmol/L 4.1  Chloride 101 - 111 mmol/L 108  CO2 22 - 32 mmol/L 21(L)  Calcium 8.9 - 10.3 mg/dL 9.1  Total Protein 6.5 - 8.1 g/dL 6.1(L)  Total Bilirubin 0.3 - 1.2 mg/dL 3.6(U0.2(L)  Alkaline Phos 38 - 126 U/L 160(H)  AST 15 - 41 U/L 21  ALT 14 - 54 U/L 15  Discharge instruction: per After Visit Summary and "Baby and Me Booklet".  After visit meds:  Allergies as of 10/27/2017   No Known Allergies     Medication List    STOP taking these medications   acetaminophen 325 MG tablet Commonly known as:  TYLENOL   Doxylamine-Pyridoxine 10-10 MG Tbec     TAKE these medications   amLODipine 10 MG tablet Commonly known as:  NORVASC Take 1 tablet (10 mg total) by mouth daily.   ferrous sulfate 325 (65 FE) MG tablet Commonly known as:  FERROUSUL Take 1 tablet (325 mg total) by mouth 3 (three) times daily with meals.   ibuprofen 600 MG tablet Commonly known as:  ADVIL,MOTRIN Take 1 tablet (600 mg total) by mouth every 6 (six) hours.   prenatal vitamin w/FE, FA 27-1 MG Tabs tablet Take 1 tablet by mouth daily at 12 noon.   senna-docusate 8.6-50 MG tablet Commonly known as:  Senokot-S Take 2 tablets by mouth daily.       Diet: routine diet  Activity: Advance as tolerated. Pelvic rest for 6 weeks.   Outpatient follow up:4 weeks Follow up Appt:No future appointments. Follow up Visit:No Follow-up on file.  Postpartum contraception: Depo Provera  Newborn Data: Live born female  Birth Weight: 6 lb 15.1 oz (3150 g) APGAR: 9, 9  Newborn Delivery    Birth date/time:  10/26/2017 14:48:00 Delivery type:  Vaginal, Spontaneous Delivery      Baby Feeding: Bottle Disposition:home with mother   10/27/2017 Suella Broad, MD

## 2017-10-27 NOTE — Progress Notes (Signed)
Post Partum Day 1 Subjective: no complaints, up ad lib, voiding, tolerating PO and + flatus  Objective: Blood pressure (!) 150/95, pulse 80, temperature 98.5 F (36.9 C), temperature source Axillary, resp. rate 18, height 5\' 4"  (1.626 m), weight 244 lb (110.7 kg), last menstrual period 02/03/2017, SpO2 99 %, unknown if currently breastfeeding.  Physical Exam:  General: alert, cooperative and no distress Lochia: appropriate Uterine Fundus: firm Incision: n/a DVT Evaluation: No evidence of DVT seen on physical exam. No cords or calf tenderness.   Recent Labs  10/26/17 0804 10/27/17 0459  HGB 9.9* 8.2*  HCT 29.2* 23.7*    Assessment/Plan: Mom wants to be discharged today if baby can go home but stay if baby cannot go home today. Bottle feeding Contraception Depo   LOS: 2 days   Nancy Jacobs 10/27/2017, 7:18 AM

## 2017-11-03 ENCOUNTER — Ambulatory Visit: Payer: Medicaid Other

## 2017-11-03 VITALS — BP 145/80 | HR 79

## 2017-11-03 DIAGNOSIS — Z0130 Encounter for examination of blood pressure without abnormal findings: Secondary | ICD-10-CM

## 2017-11-03 NOTE — Progress Notes (Signed)
Subjective:  Nancy Jacobs is a 36 y.o. female with hypertension. Current Outpatient Medications  Medication Sig Dispense Refill  . amLODipine (NORVASC) 10 MG tablet Take 1 tablet (10 mg total) by mouth daily. 30 tablet 0  . ferrous sulfate (FERROUSUL) 325 (65 FE) MG tablet Take 1 tablet (325 mg total) by mouth 3 (three) times daily with meals. (Patient not taking: Reported on 10/20/2017) 90 tablet 2  . ibuprofen (ADVIL,MOTRIN) 600 MG tablet Take 1 tablet (600 mg total) by mouth every 6 (six) hours. 30 tablet 0  . prenatal vitamin w/FE, FA (PRENATAL 1 + 1) 27-1 MG TABS tablet Take 1 tablet by mouth daily at 12 noon.    . senna-docusate (SENOKOT-S) 8.6-50 MG tablet Take 2 tablets by mouth daily. 30 tablet 0   No current facility-administered medications for this visit.     Hypertension ROS: taking medications as instructed, no medication side effects noted, no TIA's, no chest pain on exertion, no dyspnea on exertion and no swelling of ankles.  New concerns: Patient reports nurse came yesterday to home and it was 160s/104 but she had not taken her medication. Todays bp 145/80 is improving and stressed importance of taking medication regularily. Patient states that she didn't have blood pressure problems after any of her other children. Made patient aware that every postpartum is different with each child and she should come back for her postpartum visit.  Objective:   Appearance alert, well appearing, and in no distress.  Assessment:   Hypertension improved.   Plan: patient to return to office next week for another blood pressure check. Continue Norvasc 10mg . Discuss with patient signs[facility]symptoms [facility] headache that doesn't relieve with medication, visual changes, and/or dizziness. Patient states understanding. Armandina StammerJennifer Howard RNBSN

## 2017-11-08 ENCOUNTER — Ambulatory Visit: Payer: Medicaid Other | Admitting: Obstetrics & Gynecology

## 2017-11-11 ENCOUNTER — Ambulatory Visit: Payer: Medicaid Other | Admitting: Family Medicine

## 2017-11-24 ENCOUNTER — Telehealth: Payer: Self-pay

## 2017-11-24 NOTE — Telephone Encounter (Signed)
Patient called requesting a return to work note.   patient has missed multiple postpartum appointments. Postpartum appointment rescheduled.  Armandina StammerJennifer Verlie Liotta RN

## 2017-12-02 ENCOUNTER — Encounter: Payer: Self-pay | Admitting: Family Medicine

## 2017-12-02 ENCOUNTER — Ambulatory Visit (INDEPENDENT_AMBULATORY_CARE_PROVIDER_SITE_OTHER): Payer: Medicaid Other | Admitting: Family Medicine

## 2017-12-02 DIAGNOSIS — Z30013 Encounter for initial prescription of injectable contraceptive: Secondary | ICD-10-CM

## 2017-12-02 DIAGNOSIS — L02419 Cutaneous abscess of limb, unspecified: Secondary | ICD-10-CM

## 2017-12-02 DIAGNOSIS — I10 Essential (primary) hypertension: Secondary | ICD-10-CM

## 2017-12-02 MED ORDER — MEDROXYPROGESTERONE ACETATE 150 MG/ML IM SUSP: 150.0000 mg | INTRAMUSCULAR | 0 refills | Status: AC

## 2017-12-02 MED ORDER — AMLODIPINE BESYLATE 10 MG PO TABS: 10.0000 mg | ORAL_TABLET | Freq: Every day | ORAL | 3 refills | Status: DC

## 2017-12-02 MED ORDER — SULFAMETHOXAZOLE-TRIMETHOPRIM 800-160 MG PO TABS: 1.0000 | ORAL_TABLET | Freq: Two times a day (BID) | ORAL | 1 refills | Status: AC

## 2017-12-02 NOTE — Progress Notes (Signed)
First BP reading was 163/94. Repeat BP is 150/90. Pt is not taking her BP meds

## 2017-12-02 NOTE — Progress Notes (Signed)
Post Partum Exam  Nancy Jacobs is a 36 y.o. 163P3003 female who presents for a postpartum visit. She is 5 weeks postpartum following a spontaneous vaginal delivery. I have fully reviewed the prenatal and intrapartum course. The delivery was at 38 gestational weeks.  Anesthesia: epidural. Postpartum course has been unremarkable. Baby's course has been unremarkable. Baby is feeding by bottle - Nancy Jacobs. Bleeding thin lochia. Bowel function is normal. Bladder function is normal. Patient is sexually active. Contraception method is Depo-Provera injections. Postpartum depression screening:neg  The following portions of the patient's history were reviewed and updated as appropriate: allergies, current medications, past family history, past medical history, past social history, past surgical history and problem list.  Review of Systems Pertinent items noted in HPI and remainder of comprehensive ROS otherwise negative.    Objective:  unknown if currently breastfeeding.  General:  alert, cooperative and no distress   Skin:  Firm indurated area in left axilla, about 1.5cm in diameter  Lungs: clear to auscultation bilaterally  Heart:  regular rate and rhythm, S1, S2 normal, no murmur, click, rub or gallop  Abdomen: soft, non-tender; bowel sounds normal; no masses,  no organomegaly        Assessment:    Normal postpartum exam. Pap smear not done at today's visit.   Plan:   1. Contraception: Depo-Provera injections - will come back in 2 weeks 2. Continue amlodipine - pt to see PCP for continued care 3. Bactrim for developing axillary abscess - return if worsening. 4. Follow up in: 2 weeks or as needed.

## 2017-12-14 ENCOUNTER — Other Ambulatory Visit: Payer: Medicaid Other

## 2017-12-24 ENCOUNTER — Other Ambulatory Visit: Payer: Medicaid Other

## 2019-06-25 ENCOUNTER — Encounter (HOSPITAL_BASED_OUTPATIENT_CLINIC_OR_DEPARTMENT_OTHER): Payer: Self-pay | Admitting: Emergency Medicine

## 2019-06-25 ENCOUNTER — Emergency Department (HOSPITAL_BASED_OUTPATIENT_CLINIC_OR_DEPARTMENT_OTHER)
Admission: EM | Admit: 2019-06-25 | Discharge: 2019-06-25 | Disposition: A | Discharge status: Home / Self Care | Payer: Self-pay | Attending: Emergency Medicine | Admitting: Emergency Medicine

## 2019-06-25 ENCOUNTER — Other Ambulatory Visit: Payer: Self-pay

## 2019-06-25 ENCOUNTER — Emergency Department (HOSPITAL_BASED_OUTPATIENT_CLINIC_OR_DEPARTMENT_OTHER): Payer: Self-pay

## 2019-06-25 DIAGNOSIS — F17210 Nicotine dependence, cigarettes, uncomplicated: Secondary | ICD-10-CM | POA: Insufficient documentation

## 2019-06-25 DIAGNOSIS — I160 Hypertensive urgency: Secondary | ICD-10-CM | POA: Insufficient documentation

## 2019-06-25 DIAGNOSIS — Z79899 Other long term (current) drug therapy: Secondary | ICD-10-CM | POA: Insufficient documentation

## 2019-06-25 HISTORY — DX: Essential (primary) hypertension: I10

## 2019-06-25 LAB — BASIC METABOLIC PANEL
Anion gap: 6 (ref 5–15)
BUN: 11 mg/dL (ref 6–20)
CO2: 24 mmol/L (ref 22–32)
Calcium: 8.4 mg/dL — ABNORMAL LOW (ref 8.9–10.3)
Chloride: 109 mmol/L (ref 98–111)
Creatinine, Ser: 0.94 mg/dL (ref 0.44–1.00)
GFR calc Af Amer: >60 mL/min (ref >60)
GFR calc non Af Amer: >60 mL/min (ref >60)
Glucose, Bld: 61 mg/dL — ABNORMAL LOW (ref 70–99)
Potassium: 3.4 mmol/L — ABNORMAL LOW (ref 3.5–5.1)
Sodium: 139 mmol/L (ref 135–145)

## 2019-06-25 LAB — CBC
HCT: 37.4 % (ref 36.0–46.0)
Hemoglobin: 11.5 g/dL — ABNORMAL LOW (ref 12.0–15.0)
MCH: 30.6 pg (ref 26.0–34.0)
MCHC: 30.7 g/dL (ref 30.0–36.0)
MCV: 99.5 fL (ref 80.0–100.0)
Platelets: 358 10*3/uL (ref 150–400)
RBC: 3.76 MIL/uL — ABNORMAL LOW (ref 3.87–5.11)
RDW: 13.9 % (ref 11.5–15.5)
WBC: 8.7 10*3/uL (ref 4.0–10.5)
nRBC: 0 % (ref 0.0–0.2)

## 2019-06-25 LAB — TROPONIN I (HIGH SENSITIVITY): Troponin I (High Sensitivity): <2 ng/L (ref <18)

## 2019-06-25 MED ORDER — AMLODIPINE BESYLATE 5 MG PO TABS: 5.0000 mg | ORAL_TABLET | Freq: Every day | ORAL | 0 refills | Status: AC

## 2019-06-25 NOTE — ED Provider Notes (Signed)
[facility] EMERGENCY DEPARTMENT Provider Note   CSN: [id number] Arrival date & time: 06/25/19  1601     History   Chief Complaint Chief Complaint  Patient presents with  . Chest Pain  . Headache    HPI Nancy Jacobs is a 38 y.o. female.     Patient is a 38 year old female with past medical history of hypertension.  She presents today with complaints of headache and chest discomfort.  She describes a dull headache for the past several days with associated discomfort to the left side of her chest.  She describes this as an aching.  There is no shortness of breath, nausea, diaphoresis, or radiation to the arm or jaw.  She denies any exertional symptoms.  Patient does describe being out of her blood pressure medications for the past month.  She is concerned her blood pressure may be causing these issues.  The history is provided by the patient.  Chest Pain Pain location:  Substernal area Pain quality: tightness   Pain radiates to:  Does not radiate Pain severity:  Mild Onset quality:  Gradual Duration:  3 days Timing:  Constant Progression:  Worsening Chronicity:  New Relieved by:  Nothing Worsened by:  Nothing Ineffective treatments:  None tried Associated symptoms: headache   Headache   Past Medical History:  Diagnosis Date  . Hypertension   . Vaginal Pap smear, abnormal     Patient Active Problem List   Diagnosis Date Noted  . Essential hypertension 12/02/2017    Past Surgical History:  Procedure Laterality Date  . KNEE CARTILAGE SURGERY    . LEG SURGERY       OB History    Gravida  3   Para  3   Term  3   Preterm      AB      Living  3     SAB      TAB      Ectopic      Multiple  0   Live Births  3            Home Medications    Prior to Admission medications   Medication Sig Start Date End Date Taking? Authorizing Provider  amLODipine (NORVASC) 10 MG tablet Take 1 tablet (10 mg total) by mouth daily. 12/02/17    Truett Mainland, DO  medroxyPROGESTERone (DEPO-PROVERA) 150 MG/ML injection Inject 1 mL (150 mg total) into the muscle every 3 (three) months. 12/02/17   Truett Mainland, DO  prenatal vitamin w/FE, FA (PRENATAL 1 + 1) 27-1 MG TABS tablet Take 1 tablet by mouth daily at 12 noon.    [provider]  sulfamethoxazole-trimethoprim (BACTRIM DS,SEPTRA DS) 800-160 MG tablet Take 1 tablet by mouth 2 (two) times daily. 12/02/17   Truett Mainland, DO    Family History Family History  Problem Relation Age of Onset  . Cancer Mother        liver  . Hypertension Mother   . Hypertension Father   . Hypertension Sister   . Cancer Maternal Aunt   . Cancer Maternal Grandmother   . Diabetes Maternal Grandfather     Social History Social History   Tobacco Use  . Smoking status: Current Some Day Smoker    Packs/day: 0.50  . Smokeless tobacco: Never Used  Substance Use Topics  . Alcohol use: Yes    Comment: occasional  . Drug use: Yes    Types: Marijuana     Allergies  Patient has no known allergies.   Review of Systems Review of Systems  Cardiovascular: Positive for chest pain.  Neurological: Positive for headaches.  All other systems reviewed and are negative.    Physical Exam Updated Vital Signs BP (!) 157/99 (BP Location: Right Arm)   Pulse 83   Temp 98.7 F (37.1 C) (Oral)   Resp 18   Ht 5\' 4"  (1.626 m)   Wt 102.1 kg   LMP 06/22/2019   SpO2 100%   BMI 38.62 kg/m   Physical Exam Vitals signs and nursing note reviewed.  Constitutional:      General: She is not in acute distress.    Appearance: She is well-developed. She is not diaphoretic.  HENT:     Head: Normocephalic and atraumatic.  Neck:     Musculoskeletal: Normal range of motion and neck supple.  Cardiovascular:     Rate and Rhythm: Normal rate and regular rhythm.     Heart sounds: No murmur. No friction rub. No gallop.   Pulmonary:     Effort: Pulmonary effort is normal. No respiratory distress.      Breath sounds: Normal breath sounds. No wheezing.  Abdominal:     General: Bowel sounds are normal. There is no distension.     Palpations: Abdomen is soft.     Tenderness: There is no abdominal tenderness.  Musculoskeletal: Normal range of motion.     Right lower leg: She exhibits no tenderness. No edema.     Left lower leg: She exhibits no tenderness. No edema.  Skin:    General: Skin is warm and dry.  Neurological:     Mental Status: She is alert and oriented to person, place, and time.      ED Treatments / Results  Labs (all labs ordered are listed, but only abnormal results are displayed) Labs Reviewed  BASIC METABOLIC PANEL  CBC  TROPONIN I (HIGH SENSITIVITY)  TROPONIN I (HIGH SENSITIVITY)  PREGNANCY, URINE    EKG EKG Interpretation  Date/Time:  Sunday June 25 2019 09:47:51 EDT Ventricular Rate:  70 PR Interval:    QRS Duration: 95 QT Interval:  399 QTC Calculation: 431 R Axis:   51 Text Interpretation:  Sinus rhythm Borderline prolonged PR interval Probable left ventricular hypertrophy Baseline wander in lead(s) V6 Confirmed by Geoffery LyonseLo, Onur Mori ([id number]) on 06/25/2019 9:52:17 AM   Radiology No results found.  Procedures Procedures (including critical care time)  Medications Ordered in ED Medications - No data to display   Initial Impression / Assessment and Plan / ED Course  I have reviewed the triage vital signs and the nursing notes.  Pertinent labs & imaging results that were available during my care of the patient were reviewed by me and considered in my medical decision making (see chart for details).  Patient presenting here with complaints of headache and chest discomfort for the past several days.  Patient has a history of hypertension, but has been off of her medications.  [place] and [place] a primary doctor.  She has since run out of her medicines.    Her physical examination is unremarkable and EKG  and laboratory studies are essentially normal.  At this point, I see no indication for further work-up.  She is mildly hypertensive here in the ER.  She will be restarted on her amlodipine and advised to obtain a primary doctor locally for future prescriptions.  Final Clinical Impressions(s) / ED Diagnoses  Final diagnoses:  None    ED Discharge Orders    None       Geoffery Lyonselo, Weltha Cathy, MD 06/25/19 1108

## 2019-06-25 NOTE — ED Triage Notes (Signed)
Pt reports L side chest pressure and headache for several days. She has been out of her BP meds x 1 month.

## 2019-06-25 NOTE — ED Notes (Signed)
Delay in xray. Nurse asked for xray to return after MD evaluated patient.

## 2019-06-25 NOTE — Discharge Instructions (Signed)
Begin taking amlodipine as prescribed.  Follow-up with a local primary doctor to obtain primary care.  Return to the ER if symptoms significantly worsen or change.

## 2019-06-25 NOTE — ED Notes (Signed)
Patient verbalizes understanding of discharge instructions. Opportunity for questioning and answers were provided. Armband removed by staff, pt discharged from ED.  

## 2021-01-22 IMAGING — CR CHEST - 2 VIEW
2 series · 2 of 2 positions shown · non-contrast
Comparison: Chest x-ray dated 08/31/2015.

CLINICAL DATA: LEFT-sided chest pressure and headache for several
days.

EXAM:
CHEST - 2 VIEW

[w chest pa]
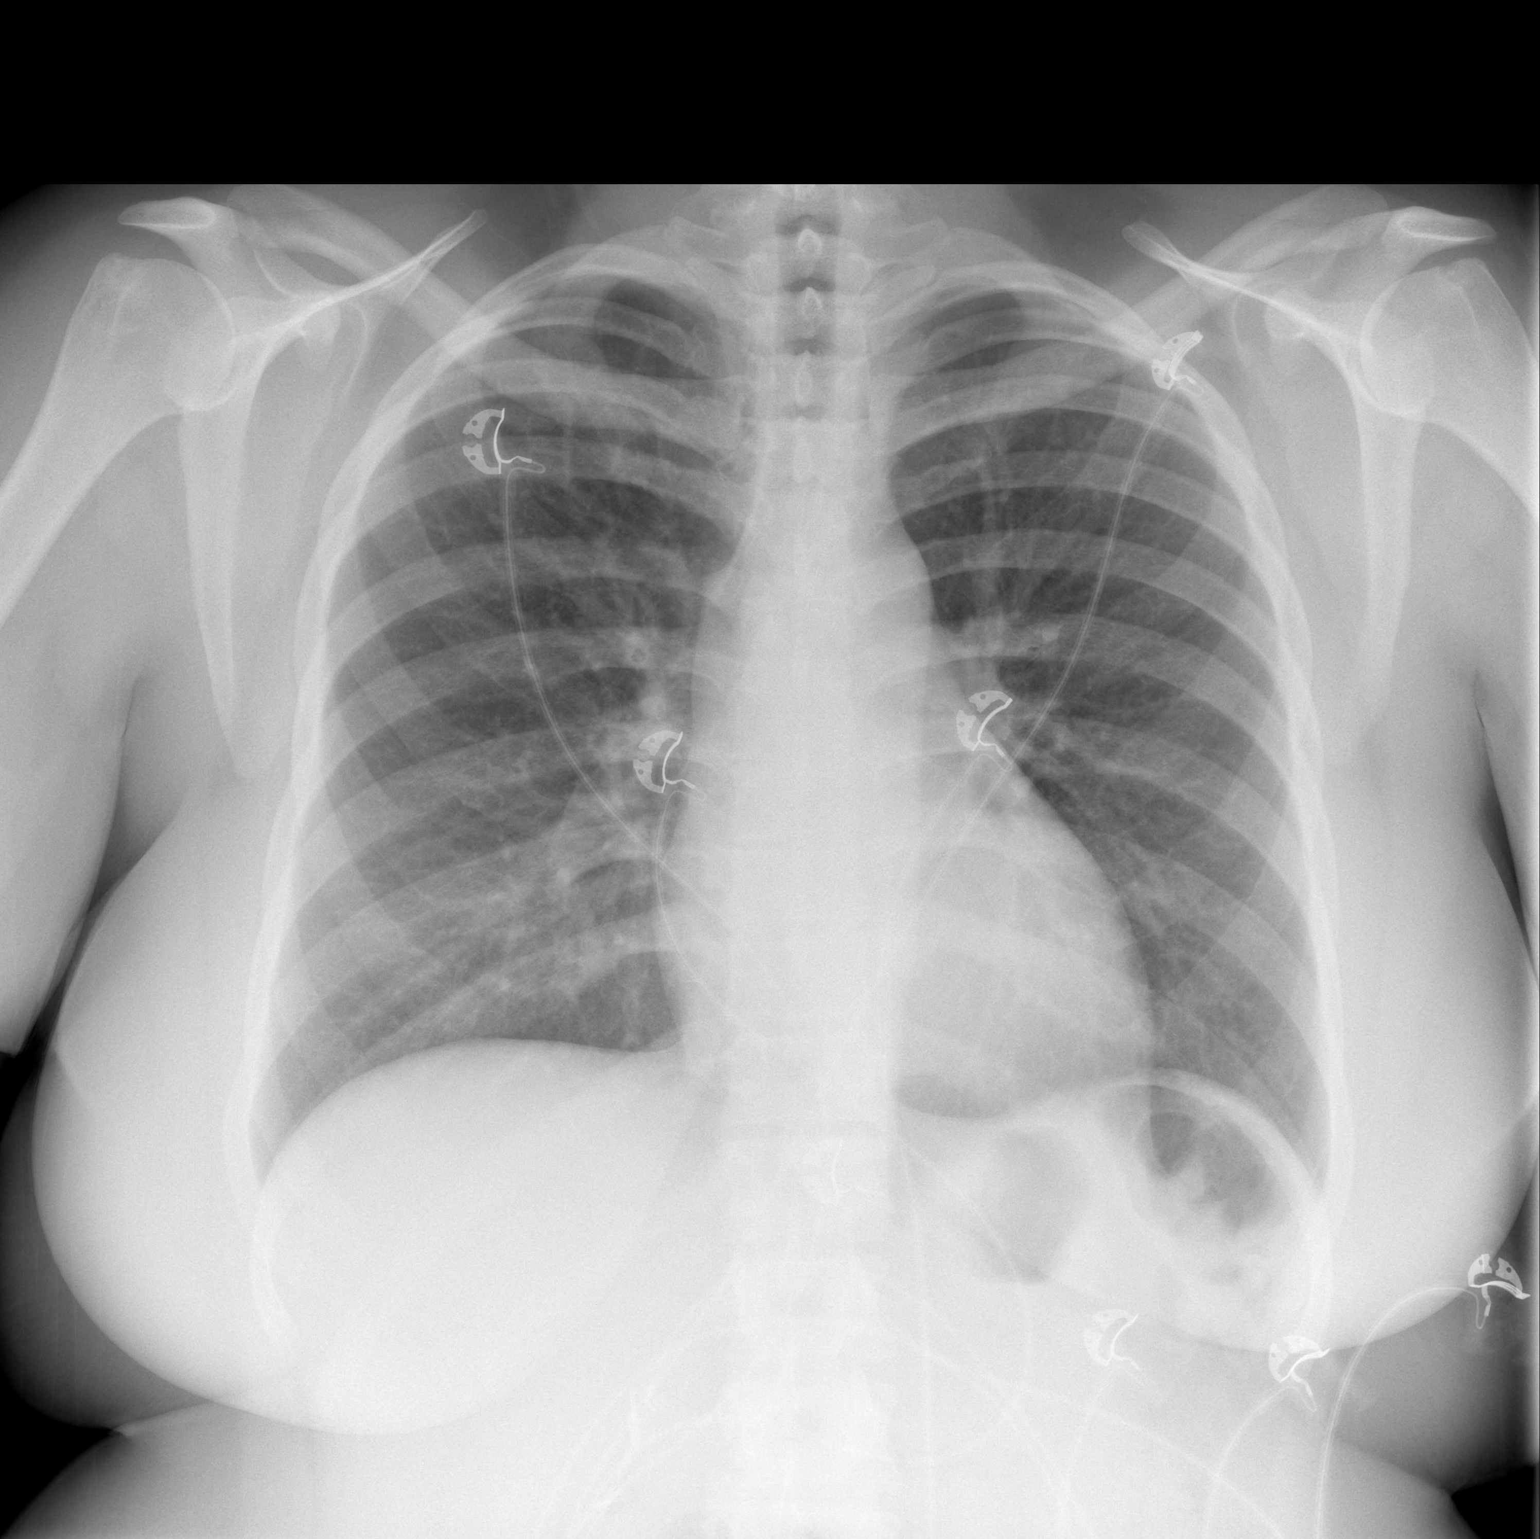

[w chest lat]
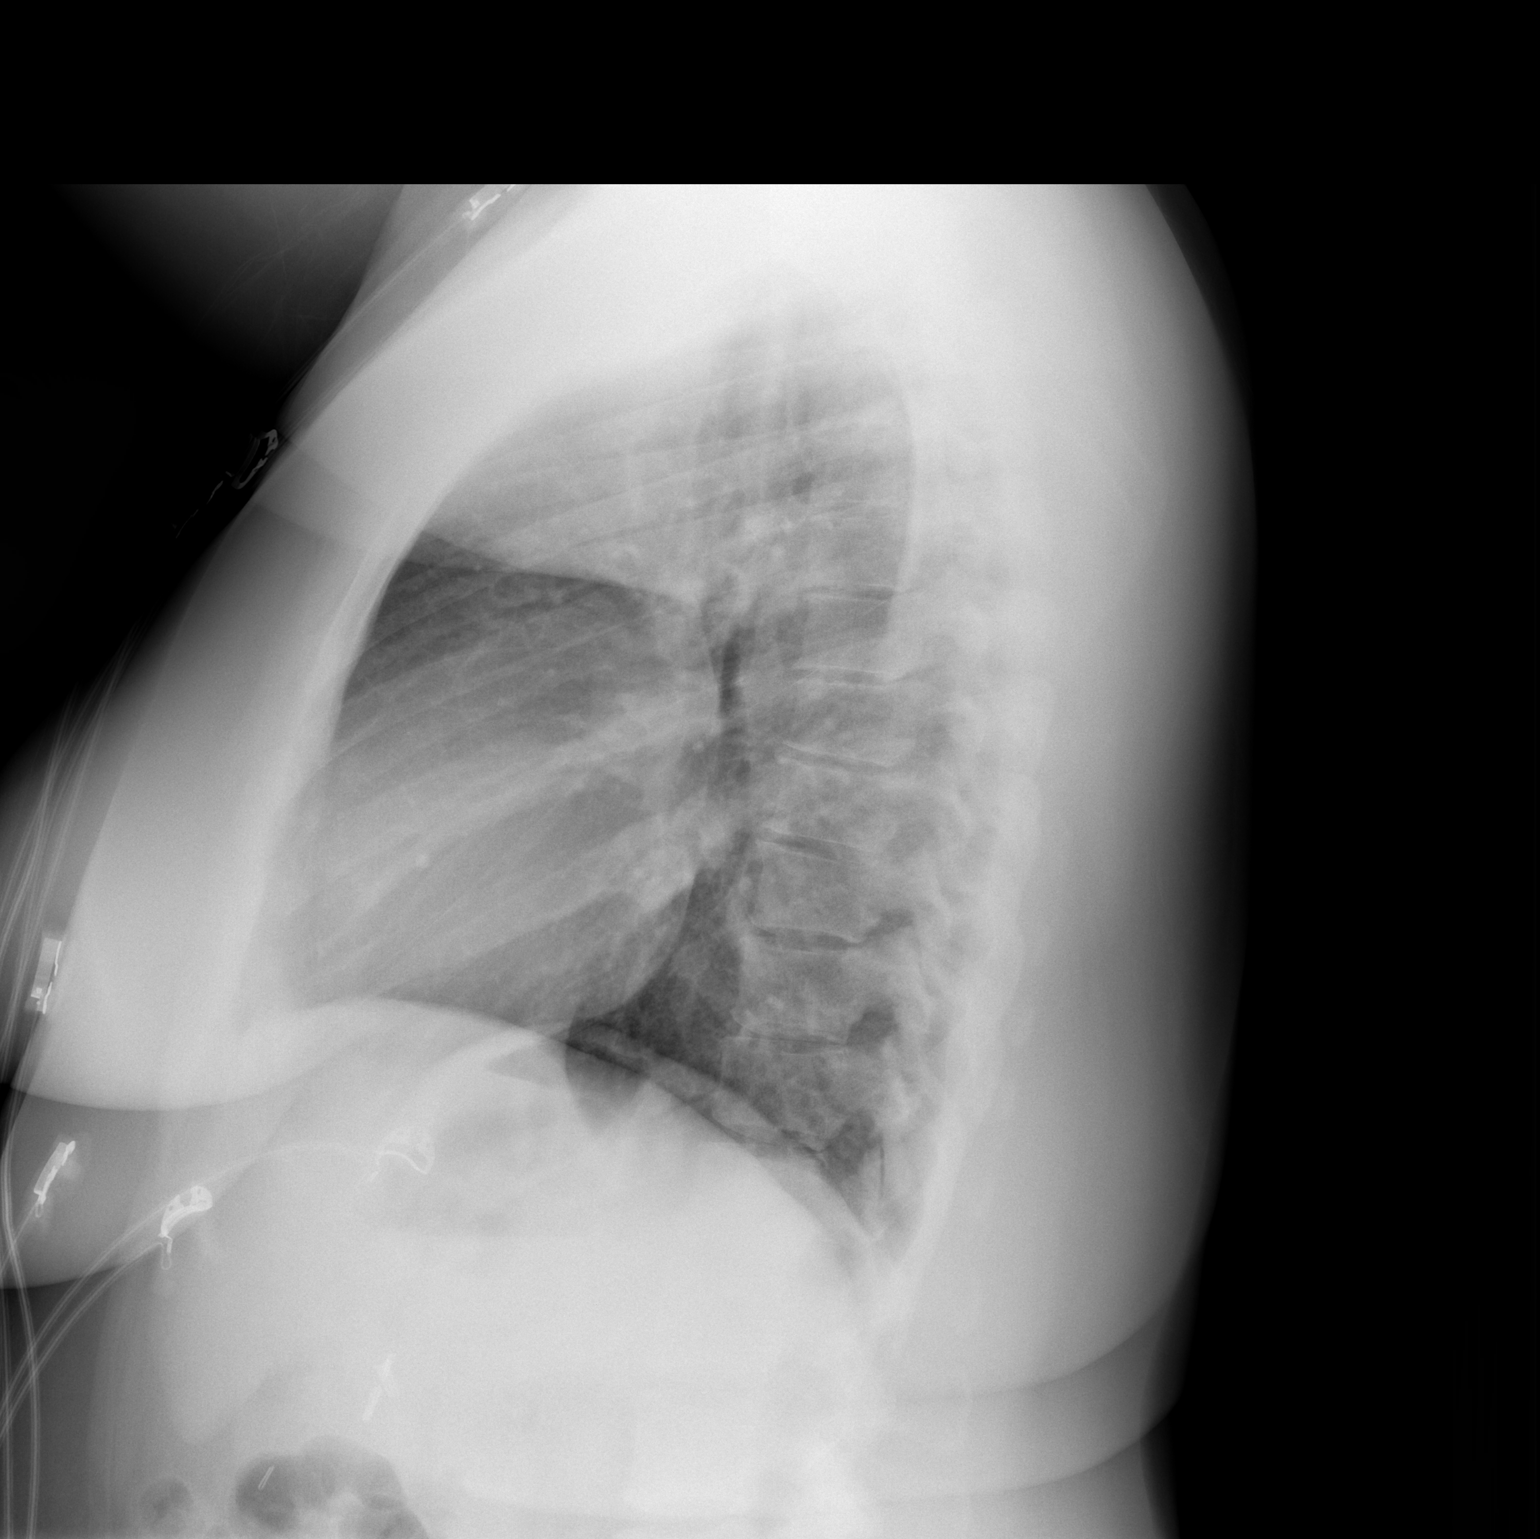

[2 of 2 positions shown; findings below may reference images not displayed]

FINDINGS: Cardiomediastinal silhouette is within normal limits in size and
configuration. Lungs are clear. Lung volumes are normal. No evidence
of pneumonia. No pleural effusion. No pneumothorax seen. Osseous
structures about the chest are unremarkable.
IMPRESSION: No active cardiopulmonary disease.
